# Patient Record
Sex: Female | Born: 1990 | Race: White | Hispanic: No | Marital: Married | Smoking: Never smoker
Health system: Southern US, Community
[De-identification: ages and names within clinical notes are randomized; demographics above are authoritative.]

## PROBLEM LIST (undated history)

## (undated) DIAGNOSIS — F419 Anxiety disorder, unspecified: Secondary | ICD-10-CM

## (undated) HISTORY — DX: Anxiety disorder, unspecified: F41.9

## (undated) HISTORY — PX: WISDOM TOOTH EXTRACTION: SHX21

---
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
## 2014-07-11 ENCOUNTER — Encounter: Payer: Self-pay | Admitting: Family

## 2014-07-11 ENCOUNTER — Ambulatory Visit (INDEPENDENT_AMBULATORY_CARE_PROVIDER_SITE_OTHER): Payer: 59 | Admitting: Family

## 2014-07-11 VITALS — BP 100/78 | HR 75 | Temp 97.7°F | Resp 14 | Ht 62.0 in | Wt 185.8 lb

## 2014-07-11 DIAGNOSIS — F411 Generalized anxiety disorder: Secondary | ICD-10-CM

## 2014-07-11 LAB — BASIC METABOLIC PANEL
BUN: 11 mg/dL (ref 6–23)
CALCIUM: 9.5 mg/dL (ref 8.4–10.5)
CO2: 26 meq/L (ref 19–32)
CREATININE: 0.73 mg/dL (ref 0.40–1.20)
Chloride: 107 mEq/L (ref 96–112)
GFR: 102.33 mL/min (ref >60.00)
GLUCOSE: 78 mg/dL (ref 70–99)
Potassium: 3.7 mEq/L (ref 3.5–5.1)
Sodium: 139 mEq/L (ref 135–145)

## 2014-07-11 LAB — TSH: TSH: 1.44 u[IU]/mL (ref 0.35–4.50)

## 2014-07-11 NOTE — Progress Notes (Signed)
Pre visit review using our clinic review tool, if applicable. No additional management support is needed unless otherwise documented below in the visit note. 

## 2014-07-11 NOTE — Patient Instructions (Signed)
Please complete lab work prior to leaving.   Schedule a physical at the front desk.  Welcome to [facility]!

## 2014-07-11 NOTE — Progress Notes (Signed)
Subjective:    Patient ID: Katherine Hardy, female    DOB: Oct 26, 1990, 24 y.o.   MRN: [id number]  HPI  [name] is a 24 yr old female who presents today to establish care.  Reports that she has suffered from anxiety since age 89.  Reports that recently when she lays down to sleep she has been having more anxiety.  Reports that she was on prozac in college- had SI with prozac.  She denies depression Sister and gm have anxiety.     Review of Systems  Constitutional: Negative for unexpected weight change.  HENT: Negative for hearing loss and rhinorrhea.   Eyes: Negative for visual disturbance.  Respiratory: Negative for cough.   Cardiovascular: Negative for leg swelling.       Occasional palpitations with anxiety  Gastrointestinal: Negative for nausea, diarrhea and constipation.  Genitourinary: Negative for dysuria and frequency.  Musculoskeletal: Negative for myalgias and arthralgias.  Skin: Negative for rash.  Neurological: Negative for headaches.  Hematological: Negative for adenopathy.  Psychiatric/Behavioral:       See HPI    Past Medical History  Diagnosis Date  . Anxiety     History   Social History  . Marital Status: Married    Spouse Name: N/A  . Number of Children: N/A  . Years of Education: N/A   Occupational History  . Not on file.   Social History Main Topics  . Smoking status: Never Smoker   . Smokeless tobacco: Never Used  . Alcohol Use: Yes     Comment: social  . Drug Use: No  . Sexual Activity: Not on file   Other Topics Concern  . 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 No pertinent past surgical history.  Family History  Problem Relation Age of Onset  . Hypertension Mother   . Hyperlipidemia Maternal Grandmother   . Macular degeneration Maternal Grandmother   . Hyperlipidemia Maternal Grandfather   . Heart disease Paternal Grandfather  60    CABG/stent  . Diabetes Paternal Grandfather   . Cancer Neg Hx     Allergies  Allergen Reactions  . Sulfa Antibiotics Other (See Comments)    Syncope, n/v     No current outpatient prescriptions on file prior to visit.   No current facility-administered medications on file prior to visit.    BP 100/78 mmHg  Pulse 75  Temp(Src) 97.7 F (36.5 C) (Oral)  Resp 14  Ht  (1.575 m)  Wt 185 lb 12.8 oz (84.278 kg)  BMI 33.97 kg/m2  SpO2 99%  LMP 07/10/2014   .    Objective:   Physical Exam  Constitutional: She is oriented to person, place, and time. She appears well-developed and well-nourished. No distress.  HENT:  Head: Normocephalic and atraumatic.  Cardiovascular: Normal rate and regular rhythm.   No murmur heard. Pulmonary/Chest: Effort normal and breath sounds normal. No respiratory distress. She has no wheezes. She has no rales. She exhibits no tenderness.  Lymphadenopathy:    She has no cervical adenopathy.  Neurological: She is alert and oriented to person, place, and time.  Skin: Skin is warm and dry.  Psychiatric: She has a normal mood and affect. Her behavior is normal. Judgment and thought content normal.          Assessment & Plan:  20 min spent with pt today.  >50% of this time was spent counseling pt on anxiety.

## 2014-07-11 NOTE — Assessment & Plan Note (Signed)
Reports that she and her husband are trying to conceive. We discussed referral to a therapist. Reports she has tried this in the past and found this unhelpful.  Advised against the use of prn benzos due to pregnancy category.  Monitor for now.  We did discuss a prenatal vitamin and she tells me she is taking one.

## 2014-07-14 ENCOUNTER — Ambulatory Visit (INDEPENDENT_AMBULATORY_CARE_PROVIDER_SITE_OTHER): Payer: 59 | Admitting: Adult Health

## 2014-07-14 ENCOUNTER — Encounter: Payer: Self-pay | Admitting: Adult Health

## 2014-07-14 VITALS — BP 110/80 | HR 76 | Temp 98.2°F | Resp 18 | Ht 62.0 in | Wt 181.0 lb

## 2014-07-14 DIAGNOSIS — B9789 Other viral agents as the cause of diseases classified elsewhere: Secondary | ICD-10-CM

## 2014-07-14 DIAGNOSIS — J038 Acute tonsillitis due to other specified organisms: Secondary | ICD-10-CM

## 2014-07-14 MED ORDER — LIDOCAINE VISCOUS 2 % MT SOLN: 15.0000 mL | OROMUCOSAL | Status: DC | PRN

## 2014-07-14 NOTE — Progress Notes (Signed)
   Subjective:    Patient ID: Katherine Hardy, female    DOB: 02/04/1991, 24 y.o.   MRN: [id number]  HPI  [name] presents to the office today for sore throat, fever, non productive cough, and bilateral ear pain for three days after taking care of someone in the ER with strep. She endorses fever up to 101 F at home and has been taking Ibuprofen for relief of fever and throat pain. Taking Delsym for dry hacking cough- which she endorses works well.     Review of Systems  Constitutional: Positive for fever and fatigue. Negative for activity change and appetite change.  HENT: Positive for ear pain, postnasal drip and sore throat.   Respiratory: Positive for cough. Negative for shortness of breath.        Non productive  Gastrointestinal: Negative for nausea, vomiting and diarrhea.  Musculoskeletal: Negative for myalgias, neck pain and neck stiffness.  Neurological: Negative for dizziness, light-headedness and headaches.   Past Medical History  Diagnosis Date  . Anxiety     History   Social History  . Marital Status: Married    Spouse Name: N/A  . Number of Children: N/A  . Years of Education: N/A   Occupational History  . Not on file.   Social History Main Topics  . Smoking status: Never Smoker   . Smokeless tobacco: Never Used  . Alcohol Use: Yes     Comment: social  . Drug Use: No  . Sexual Activity: Not on file   Other Topics Concern  . 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 Sulfa Antibiotics Other (See Comments)    Syncope, n/v    BP 110/80 mmHg  Pulse 76  Temp(Src) 98.2 F (36.8 C) (Oral)  Resp 18  Ht 5\' 2"  (1.575 m)  Wt 181 lb (82.101 kg)  BMI 33.10 kg/m2  SpO2 99%  LMP 07/10/2014      Objective:   Physical Exam  Constitutional: She is oriented to person, place, and time. She appears well-developed and well-nourished.  HENT:  Head:  Normocephalic.  Right Ear: External ear normal.  Left Ear: External ear normal.  Mouth/Throat: Oropharynx is clear and moist. No oropharyngeal exudate.  Back of throat with slight erythemia.    Eyes: Right eye exhibits no discharge. Left eye exhibits no discharge.  Cardiovascular: Normal rate and normal heart sounds.   Pulmonary/Chest: Effort normal and breath sounds normal. No respiratory distress. She has no wheezes. She has no rales.  Musculoskeletal: Normal range of motion.  Lymphadenopathy:    She has cervical adenopathy.  Neurological: She is alert and oriented to person, place, and time.  Skin: Skin is warm and dry.  Vitals reviewed.     Assessment & Plan:    Viral Tonsilitis - Rapid strep was negative - Centor criteria was 2 - Sent strep test to lab due to high suspicion of strep  - Sent prescription for lidocaine viscous to pharmacy to help with sore throat - OTC Delysm for cough - OTC ibuprofen for fever - Will follow up with patient once lab is back.  - Advised to call if not feeling better in three days.

## 2014-07-14 NOTE — Patient Instructions (Addendum)
Rapid strep came back negative, you likely have a viral infection. I will send it to the lab and should have the results by Monday. I will call you if it comes back positive for strep.  I have sent a prescription to your pharmacy for lidocaine viscous. Gargle and spit 15ml as needed for relief from your sore throat  Please let me know if you are not feeling any better in 3 days. Get plenty of rest and drink plenty of fluids. I hope you feel better!

## 2014-07-14 NOTE — Progress Notes (Signed)
Pre visit review using our clinic review tool, if applicable. No additional management support is needed unless otherwise documented below in the visit note. 

## 2014-07-15 LAB — STREP A DNA PROBE: GASP: NEGATIVE

## 2014-08-22 ENCOUNTER — Telehealth: Payer: Self-pay | Admitting: Family

## 2014-08-22 NOTE — Telephone Encounter (Signed)
previst letter for annual physical mailes °

## 2014-09-11 ENCOUNTER — Telehealth: Payer: Self-pay | Admitting: *Deleted

## 2014-09-11 NOTE — Telephone Encounter (Signed)
Patient cancelled appointment.

## 2014-09-12 ENCOUNTER — Encounter: Payer: 59 | Admitting: Family

## 2014-10-25 ENCOUNTER — Ambulatory Visit: Payer: 59 | Admitting: Cardiology

## 2014-11-29 ENCOUNTER — Ambulatory Visit (INDEPENDENT_AMBULATORY_CARE_PROVIDER_SITE_OTHER): Payer: 59 | Admitting: Family

## 2014-11-29 ENCOUNTER — Encounter: Payer: Self-pay | Admitting: Family

## 2014-11-29 ENCOUNTER — Telehealth: Payer: Self-pay | Admitting: Family

## 2014-11-29 VITALS — BP 116/78 | HR 88 | Temp 97.8°F | Resp 18 | Wt 190.4 lb

## 2014-11-29 DIAGNOSIS — R509 Fever, unspecified: Secondary | ICD-10-CM

## 2014-11-29 LAB — POCT URINALYSIS DIPSTICK
BILIRUBIN UA: NEGATIVE
Glucose, UA: NEGATIVE
KETONES UA: NEGATIVE
Leukocytes, UA: NEGATIVE
Nitrite, UA: NEGATIVE
RBC UA: NEGATIVE
Spec Grav, UA: 1.025
UROBILINOGEN UA: 1
pH, UA: 6

## 2014-11-29 NOTE — Telephone Encounter (Signed)
Caller name:Paullin, Adanely Relation to ZO:XWRU Call back number:[phone] Pharmacy:  Reason for call: pt states that she injuried her back and the ER gave her a rx robaxin, pt states she has been running a fever since she has been on the meds, pt would like to know if she stop taking the meds will she be ok, states she had researched it and it stated to contact her doctor while taking the meds if any concerns.

## 2014-11-29 NOTE — Telephone Encounter (Signed)
Spoke w pt and pt states fever has been running between 101 and 102. Scheduled pt for 215 appt today.

## 2014-11-29 NOTE — Addendum Note (Signed)
Addended by: Neldon Labella on: 11/29/2014 02:51 PM   Modules accepted: Orders

## 2014-11-29 NOTE — Progress Notes (Addendum)
   Subjective:    Patient ID: Katherine Hardy, female    DOB: 1991/01/03, 24 y.o.   MRN: [id number]  HPI  Katherine Hardy is a 24 yr old female who presents today with chief complaint of fever.  She works in the ED as a Engineer, civil (consulting) and reports that she developed left sided neck pain which radiated up the back of her neck. Was given rx for robaxin by one of the ER physicians.  Reports that her neck pain is feeling better.    Today she develop fever, myalgia, fatigue with a Tmax of 102.  Reports that she took motrin  and her fever has come down.      Review of Systems  Constitutional: Positive for fever, chills and fatigue.  HENT: Negative for ear pain and rhinorrhea.        Denies throat pain.   Respiratory: Negative for cough.   Gastrointestinal: Negative for nausea, vomiting and diarrhea.  Genitourinary: Negative for dysuria, frequency and hematuria.  Musculoskeletal: Positive for myalgias.      see HPI  Past Medical History  Diagnosis Date  . Anxiety     History   Social History  . Marital Status: Married    Spouse Name: N/A  . Number of Children: N/A  . Years of Education: N/A   Occupational History  . Not on file.   Social History Main Topics  . Smoking status: Never Smoker   . Smokeless tobacco: Never Used  . Alcohol Use: Yes     Comment: social  . Drug Use: No  . Sexual Activity: Not on file   Other Topics Concern  . 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  Family History  Problem Relation Age of Onset  . Hypertension Mother   . Hyperlipidemia Maternal Grandmother   . Macular degeneration Maternal Grandmother   . Hyperlipidemia Maternal Grandfather   . Heart disease Paternal Grandfather 60    CABG/stent  . Diabetes Paternal Grandfather   . Cancer Neg Hx     Allergies  Allergen Reactions  . Sulfa Antibiotics Other (See Comments)   Syncope, n/v     No current outpatient prescriptions on file prior to visit.   No current facility-administered medications on file prior to visit.    BP 116/78 mmHg  Pulse 88  Temp(Src) 97.8 F (36.6 C) (Oral)  Resp 18  Wt 190 lb 6.4 oz (86.365 kg)  SpO2 98%  LMP 11/27/2014    Objective:   Physical Exam  Constitutional: She is oriented to person, place, and time. She appears well-developed and well-nourished.  HENT:  Head: Normocephalic and atraumatic.  Right Ear: Tympanic membrane and ear canal normal.  Left Ear: Tympanic membrane and ear canal normal.  Mouth/Throat: No oropharyngeal exudate, posterior oropharyngeal edema or posterior oropharyngeal erythema.  Neck:  No nuchal rigidity  Cardiovascular: Normal rate, regular rhythm and normal heart sounds.   No murmur heard. Pulmonary/Chest: Effort normal and breath sounds normal. No respiratory distress. She has no wheezes.  Neurological: She is alert and oriented to person, place, and time.  Psychiatric: She has a normal mood and affect. Her behavior is normal. Judgment and thought content normal.          Assessment & Plan:

## 2014-11-29 NOTE — Patient Instructions (Signed)
Continue tylenol  or motrin  every 6 hours as needed for pain/fever. Drink 6-8 glasses of fluid a day. Get plenty of rest. Call if symptoms worsen or if you are not feeling better in 3 days.

## 2014-11-29 NOTE — Progress Notes (Signed)
Pre visit review using our clinic review tool, if applicable. No additional management support is needed unless otherwise documented below in the visit note. 

## 2014-11-29 NOTE — Assessment & Plan Note (Signed)
Suspect that this is due to underlying viral illness.  Advise alternate tylenol/motrin as needed for pain fever. Call if symptoms worsen or if symptoms do not improve.

## 2014-11-29 NOTE — Telephone Encounter (Signed)
Fever is listed as potential side effect.  Please advise.

## 2014-11-29 NOTE — Telephone Encounter (Signed)
Doubt robaxin is cause. I think pt should be seen in the office for further evaluation.

## 2014-12-01 ENCOUNTER — Encounter: Payer: Self-pay | Admitting: Emergency Medicine

## 2014-12-01 ENCOUNTER — Emergency Department
Admission: EM | Admit: 2014-12-01 | Discharge: 2014-12-01 | Disposition: A | Discharge status: Home / Self Care | Payer: 59 | Source: Home / Self Care | Attending: Family Medicine | Admitting: Family Medicine

## 2014-12-01 ENCOUNTER — Emergency Department (INDEPENDENT_AMBULATORY_CARE_PROVIDER_SITE_OTHER): Payer: 59

## 2014-12-01 ENCOUNTER — Telehealth: Payer: Self-pay | Admitting: Family

## 2014-12-01 DIAGNOSIS — J181 Lobar pneumonia, unspecified organism: Principal | ICD-10-CM

## 2014-12-01 DIAGNOSIS — J189 Pneumonia, unspecified organism: Secondary | ICD-10-CM | POA: Diagnosis not present

## 2014-12-01 DIAGNOSIS — R509 Fever, unspecified: Secondary | ICD-10-CM

## 2014-12-01 MED ORDER — AZITHROMYCIN 250 MG PO TABS: ORAL_TABLET | ORAL | Status: DC

## 2014-12-01 MED ORDER — BENZONATATE 200 MG PO CAPS: 200.0000 mg | ORAL_CAPSULE | Freq: Every day | ORAL | Status: DC

## 2014-12-01 NOTE — Telephone Encounter (Signed)
Left detailed message that order has been placed for CXR and she can come to [facility] to complete cxr this afternoon.

## 2014-12-01 NOTE — ED Notes (Signed)
Fever x 4 days, cough, chest discomfort x 2 days

## 2014-12-01 NOTE — ED Provider Notes (Signed)
CSN: [id number]     Arrival date & time 12/01/14  1111 History   First MD Initiated Contact with Patient 12/01/14 1144     Chief Complaint  Patient presents with  . Fever      HPI Comments: Patient developed a fever with chills four days ago.  Her fever has been persistent and varies between 101.5 and 103.  This morning her fever was 101.5.  Yesterday she developed a non-productive cough.  No pleuritic pain but she has mild shortness of breath when supine.  No nasal congestion or sore throat.   Her last Tdap was in 2015.  The history is provided by the patient.    Past Medical History  Diagnosis Date  . Anxiety    History reviewed. No pertinent past surgical history. Family History  Problem Relation Age of Onset  . Hypertension Mother   . Hyperlipidemia Maternal Grandmother   . Macular degeneration Maternal Grandmother   . Hyperlipidemia Maternal Grandfather   . Heart disease Paternal Grandfather 60    CABG/stent  . Diabetes Paternal Grandfather   . Cancer Neg Hx    History  Substance Use Topics  . Smoking status: Never Smoker   . Smokeless tobacco: Never Used  . Alcohol Use: Yes     Comment: social   OB History    No data available     Review of Systems No sore throat + cough No pleuritic pain No wheezing No nasal congestion ? post-nasal drainage No sinus pain/pressure No itchy/red eyes No earache No hemoptysis + mild SOB when supine + fever, + chills + nausea, resolved No vomiting No abdominal pain No diarrhea No urinary symptoms No skin rash + fatigue No myalgias No  headache    Allergies  Ppd and Sulfa antibiotics  Home Medications   Prior to Admission medications   Medication Sig Start Date End Date Taking? Authorizing Provider  acetaminophen (TYLENOL) 325 MG tablet Take 650 mg by mouth every 6 (six) hours as needed.   Yes Historical Provider, MD  ibuprofen (ADVIL,MOTRIN) 400 MG tablet Take 400 mg by mouth every 6 (six) hours as needed.   Yes  Historical Provider, MD  azithromycin (ZITHROMAX Z-PAK) 250 MG tablet Take 2 tabs today; then begin one tab once daily for 4 more days. 12/01/14   Lattie Haw, MD  benzonatate (TESSALON) 200 MG capsule Take 1 capsule (200 mg total) by mouth at bedtime. Take as needed for cough 12/01/14   Lattie Haw, MD   BP 116/85 mmHg  Pulse 90  Temp(Src) 98.5 F (36.9 C) (Oral)  Ht 5\' 2"  (1.575 m)  Wt 191 lb (86.637 kg)  BMI 34.93 kg/m2  SpO2 96%  LMP 11/27/2014 Physical Exam Nursing notes and Vital Signs reviewed. Appearance:  Patient appears stated age, and in no acute distress Eyes:  Pupils are equal, round, and reactive to light and accomodation.  Extraocular movement is intact.  Conjunctivae are not inflamed  Ears:  Canals normal.  Tympanic membranes normal.  Nose:  Mildly congested turbinates.  No sinus tenderness.   Pharynx:  Normal Neck:  Supple.   Slightly enlarged tender left posterior nodes are palpated   Lungs:  Clear to auscultation.  Breath sounds are equal.  Moving air well. Heart:  Regular rate and rhythm without murmurs, rubs, or gallops.  Rate 104 Abdomen:  Nontender without masses or hepatosplenomegaly.  Bowel sounds are present.  No CVA or flank tenderness.  Extremities:  No edema.  No  calf tenderness Skin:  No rash present.   ED Course  Procedures  None    Labs Reviewed  POCT CBC W AUTO DIFF ([facility]):  WBC 4.8; LY 18.9; MO 5.4; GR 75.7; Hgb 14.1; Platelets 228     Imaging Review Dg Chest 2 View  12/01/2014   CLINICAL DATA:  Fever for 4 days, nonproductive cough  EXAM: CHEST  2 VIEW  COMPARISON:  None.  FINDINGS: Cardiomediastinal silhouette is unremarkable. Mild mid thoracic dextroscoliosis. There is infiltrate/pneumonia in left lower lobe posteriorly best seen on lateral view. No pulmonary edema. Right lung is clear.  IMPRESSION: Infiltrate/pneumonia in left lower lobe posteriorly best seen on lateral view.   Electronically Signed   By: Natasha Mead M.D.    On: 12/01/2014 12:56     MDM   1. Left lower lobe pneumonia; suspect atypical agent such as Mycoplasma    Begin Z-pack.  Prescription written for Benzonatate [facility]) to take at bedtime for night-time cough.  Take plain guaifenesin (  extended release tabs such as Mucinex) twice daily, with plenty of water, for cough and congestion.  Get adequate rest.   Stop all antihistamines for now, and other non-prescription cough/cold preparations. Followup with Family Doctor if not improved in one week.        Lattie Haw, MD 12/01/14 [phone]

## 2014-12-01 NOTE — Telephone Encounter (Signed)
Caller name: Katherine Hardy Relationship to patient: Self  Can be reached: [phone] Pharmacy:  Reason for call: pt still have low fever and would like to know if you would like to take a chest x-ray to r/o bronchitis? She says last night when she laid down she was having some shortness of breath.

## 2014-12-01 NOTE — Discharge Instructions (Signed)
Take plain guaifenesin (1200mg  extended release tabs such as Mucinex) twice daily, with plenty of water, for cough and congestion.  Get adequate rest.   Stop all antihistamines for now, and other non-prescription cough/cold preparations.   Pneumonia Pneumonia is an infection of the lungs.  CAUSES Pneumonia may be caused by bacteria or a virus. Usually, these infections are caused by breathing infectious particles into the lungs (respiratory tract). SIGNS AND SYMPTOMS   Cough.  Fever.  Chest pain.  Increased rate of breathing.  Wheezing.  Mucus production. DIAGNOSIS  If you have the common symptoms of pneumonia, your health care provider will typically confirm the diagnosis with a chest X-ray. The X-ray will show an abnormality in the lung (pulmonary infiltrate) if you have pneumonia. Other tests of your blood, urine, or sputum may be done to find the specific cause of your pneumonia. Your health care provider may also do tests (blood gases or pulse oximetry) to see how well your lungs are working. TREATMENT  Some forms of pneumonia may be spread to other people when you cough or sneeze. You may be asked to wear a mask before and during your exam. Pneumonia that is caused by bacteria is treated with antibiotic medicine. Pneumonia that is caused by the influenza virus may be treated with an antiviral medicine. Most other viral infections must run their course. These infections will not respond to antibiotics.  HOME CARE INSTRUCTIONS   Cough suppressants may be used if you are losing too much rest. However, coughing protects you by clearing your lungs. You should avoid using cough suppressants if you can.  Your health care provider may have prescribed medicine if he or she thinks your pneumonia is caused by bacteria or influenza. Finish your medicine even if you start to feel better.  Your health care provider may also prescribe an expectorant. This loosens the mucus to be coughed  up.  Take medicines only as directed by your health care provider.  Do not smoke. Smoking is a common cause of bronchitis and can contribute to pneumonia. If you are a smoker and continue to smoke, your cough may last several weeks after your pneumonia has cleared.  A cold steam vaporizer or humidifier in your room or home may help loosen mucus.  Coughing is often worse at night. Sleeping in a semi-upright position in a recliner or using a couple pillows under your head will help with this.  Get rest as you feel it is needed. Your body will usually let you know when you need to rest. PREVENTION A pneumococcal shot (vaccine) is available to prevent a common bacterial cause of pneumonia. This is usually suggested for:  People over 57 years old.  Patients on chemotherapy.  People with chronic lung problems, such as bronchitis or emphysema.  People with immune system problems. If you are over 65 or have a high risk condition, you may receive the pneumococcal vaccine if you have not received it before. In some countries, a routine influenza vaccine is also recommended. This vaccine can help prevent some cases of pneumonia.You may be offered the influenza vaccine as part of your care. If you smoke, it is time to quit. You may receive instructions on how to stop smoking. Your health care provider can provide medicines and counseling to help you quit. SEEK MEDICAL CARE IF: You have a fever. SEEK IMMEDIATE MEDICAL CARE IF:   Your illness becomes worse. This is especially true if you are elderly or weakened from any  other disease.  You cannot control your cough with suppressants and are losing sleep.  You begin coughing up blood.  You develop pain which is getting worse or is uncontrolled with medicines.  Any of the symptoms which initially brought you in for treatment are getting worse rather than better.  You develop shortness of breath or chest pain. MAKE SURE YOU:   Understand  these instructions.  Will watch your condition.  Will get help right away if you are not doing well or get worse. Document Released: 04/14/2005 Document Revised: 08/29/2013 Document Reviewed: 07/04/2010 [facility] Patient Information 2015 [place], [place]. This information is not intended to replace advice given to you by your health care provider. Make sure you discuss any questions you have with your health care provider.

## 2014-12-04 ENCOUNTER — Telehealth: Payer: Self-pay | Admitting: *Deleted

## 2014-12-14 ENCOUNTER — Telehealth: Payer: Self-pay | Admitting: Behavioral Health

## 2014-12-14 NOTE — Telephone Encounter (Signed)
Unable to reach patient at time of Pre-Visit Call.  Left message for patient to return call when available.    

## 2014-12-15 ENCOUNTER — Telehealth: Payer: Self-pay | Admitting: Family

## 2014-12-15 ENCOUNTER — Encounter: Payer: 59 | Admitting: Family

## 2014-12-18 NOTE — Telephone Encounter (Signed)
Pt was no show 12/15/14 9:00am, 8/19 pt called at 6:09am today states she had to cancel her appt because she was called in to work, left msg for pt to call and reschedule, charge for no show?

## 2014-12-21 NOTE — Telephone Encounter (Signed)
No charge. 

## 2015-03-26 LAB — OB RESULTS CONSOLE RUBELLA ANTIBODY, IGM: Rubella: IMMUNE

## 2015-03-26 LAB — OB RESULTS CONSOLE GC/CHLAMYDIA
CHLAMYDIA, DNA PROBE: NEGATIVE
GC PROBE AMP, GENITAL: NEGATIVE

## 2015-03-26 LAB — OB RESULTS CONSOLE RPR: RPR: NONREACTIVE

## 2015-03-26 LAB — OB RESULTS CONSOLE ABO/RH: RH Type: POSITIVE

## 2015-03-26 LAB — OB RESULTS CONSOLE HEPATITIS B SURFACE ANTIGEN: Hepatitis B Surface Ag: NEGATIVE

## 2015-03-26 LAB — OB RESULTS CONSOLE HIV ANTIBODY (ROUTINE TESTING): HIV: NONREACTIVE

## 2015-03-26 LAB — OB RESULTS CONSOLE ANTIBODY SCREEN: Antibody Screen: NEGATIVE

## 2015-04-29 NOTE — L&D Delivery Note (Signed)
Delivery Note At 9:51 AM a viable and healthy female was delivered via Vaginal, Spontaneous Delivery (Presentation: ; Occiput Anterior).  APGAR: 9, 9; weight  pending .   Placenta status: Intact, Spontaneous.  Cord: 3 vessels with the following complications: None.  Cord pH: na  Anesthesia: Epidural  Episiotomy: None Lacerations:  second Suture Repair: 2.0 3.0 vicryl rapide Est. Blood Loss (mL):  100  Mom to postpartum.  Baby to Couplet care / Skin to Skin.  Jeroline Wolbert J 10/26/2015, 10:26 AM

## 2015-05-24 DIAGNOSIS — Z36 Encounter for antenatal screening of mother: Secondary | ICD-10-CM | POA: Diagnosis not present

## 2015-06-12 DIAGNOSIS — O365910 Maternal care for other known or suspected poor fetal growth, first trimester, not applicable or unspecified: Secondary | ICD-10-CM | POA: Diagnosis not present

## 2015-06-12 DIAGNOSIS — Z3A19 19 weeks gestation of pregnancy: Secondary | ICD-10-CM | POA: Diagnosis not present

## 2015-08-07 DIAGNOSIS — Z36 Encounter for antenatal screening of mother: Secondary | ICD-10-CM | POA: Diagnosis not present

## 2015-08-07 DIAGNOSIS — Z23 Encounter for immunization: Secondary | ICD-10-CM | POA: Diagnosis not present

## 2015-09-10 DIAGNOSIS — O2613 Low weight gain in pregnancy, third trimester: Secondary | ICD-10-CM | POA: Diagnosis not present

## 2015-09-10 DIAGNOSIS — Z3A32 32 weeks gestation of pregnancy: Secondary | ICD-10-CM | POA: Diagnosis not present

## 2015-09-10 DIAGNOSIS — N898 Other specified noninflammatory disorders of vagina: Secondary | ICD-10-CM | POA: Diagnosis not present

## 2015-10-15 DIAGNOSIS — Z3A37 37 weeks gestation of pregnancy: Secondary | ICD-10-CM | POA: Diagnosis not present

## 2015-10-15 DIAGNOSIS — O2613 Low weight gain in pregnancy, third trimester: Secondary | ICD-10-CM | POA: Diagnosis not present

## 2015-10-15 DIAGNOSIS — Z3403 Encounter for supervision of normal first pregnancy, third trimester: Secondary | ICD-10-CM | POA: Diagnosis not present

## 2015-10-15 DIAGNOSIS — Z36 Encounter for antenatal screening of mother: Secondary | ICD-10-CM | POA: Diagnosis not present

## 2015-10-15 LAB — OB RESULTS CONSOLE GBS: STREP GROUP B AG: NEGATIVE

## 2015-10-25 ENCOUNTER — Inpatient Hospital Stay (HOSPITAL_COMMUNITY)
Admission: AD | Admit: 2015-10-25 | Discharge: 2015-10-27 | DRG: 775 | Disposition: A | Discharge status: Home / Self Care | Payer: 59 | Source: Ambulatory Visit | Attending: Obstetrics and Gynecology | Admitting: Obstetrics and Gynecology

## 2015-10-25 ENCOUNTER — Inpatient Hospital Stay (HOSPITAL_COMMUNITY)
Admission: AD | Admit: 2015-10-25 | Discharge: 2015-10-25 | Disposition: A | Discharge status: Home / Self Care | Payer: 59 | Source: Ambulatory Visit | Attending: Obstetrics and Gynecology | Admitting: Obstetrics and Gynecology

## 2015-10-25 ENCOUNTER — Encounter (HOSPITAL_COMMUNITY): Payer: Self-pay | Admitting: *Deleted

## 2015-10-25 DIAGNOSIS — O4202 Full-term premature rupture of membranes, onset of labor within 24 hours of rupture: Principal | ICD-10-CM | POA: Diagnosis present

## 2015-10-25 DIAGNOSIS — Z8249 Family history of ischemic heart disease and other diseases of the circulatory system: Secondary | ICD-10-CM

## 2015-10-25 DIAGNOSIS — Z3A38 38 weeks gestation of pregnancy: Secondary | ICD-10-CM

## 2015-10-25 DIAGNOSIS — Z833 Family history of diabetes mellitus: Secondary | ICD-10-CM | POA: Diagnosis not present

## 2015-10-25 DIAGNOSIS — K219 Gastro-esophageal reflux disease without esophagitis: Secondary | ICD-10-CM | POA: Diagnosis present

## 2015-10-25 DIAGNOSIS — O3663X0 Maternal care for excessive fetal growth, third trimester, not applicable or unspecified: Secondary | ICD-10-CM | POA: Diagnosis present

## 2015-10-25 DIAGNOSIS — IMO0001 Reserved for inherently not codable concepts without codable children: Secondary | ICD-10-CM

## 2015-10-25 DIAGNOSIS — O9962 Diseases of the digestive system complicating childbirth: Secondary | ICD-10-CM | POA: Diagnosis present

## 2015-10-25 LAB — TYPE AND SCREEN
ABO/RH(D): O POS
Antibody Screen: NEGATIVE

## 2015-10-25 LAB — CBC
HEMATOCRIT: 37.1 % (ref 36.0–46.0)
HEMOGLOBIN: 12.9 g/dL (ref 12.0–15.0)
MCH: 29.3 pg (ref 26.0–34.0)
MCHC: 34.8 g/dL (ref 30.0–36.0)
MCV: 84.3 fL (ref 78.0–100.0)
PLATELETS: 211 10*3/uL (ref 150–400)
RBC: 4.4 MIL/uL (ref 3.87–5.11)
RDW: 13.2 % (ref 11.5–15.5)
WBC: 9.6 10*3/uL (ref 4.0–10.5)

## 2015-10-25 LAB — POCT FERN TEST: POCT FERN TEST: POSITIVE

## 2015-10-25 MED ORDER — OXYCODONE-ACETAMINOPHEN 5-325 MG PO TABS: 1.0000 | ORAL_TABLET | ORAL | Status: DC | PRN

## 2015-10-25 MED ORDER — OXYTOCIN 40 UNITS IN LACTATED RINGERS INFUSION - SIMPLE MED: 2.5000 [IU]/h | INTRAVENOUS | Status: DC

## 2015-10-25 MED ORDER — OXYTOCIN BOLUS FROM INFUSION: 500.0000 mL | INTRAVENOUS | Status: DC

## 2015-10-25 MED ORDER — LACTATED RINGERS IV SOLN
INTRAVENOUS | Status: DC
  Administered 2015-10-25: 22:00:00 via INTRAVENOUS

## 2015-10-25 MED ORDER — FLEET ENEMA 7-19 GM/118ML RE ENEM: 1.0000 | ENEMA | RECTAL | Status: DC | PRN

## 2015-10-25 MED ORDER — OXYCODONE-ACETAMINOPHEN 5-325 MG PO TABS: 2.0000 | ORAL_TABLET | ORAL | Status: DC | PRN

## 2015-10-25 MED ORDER — ONDANSETRON HCL 4 MG/2ML IJ SOLN
4.0000 mg | Freq: Four times a day (QID) | INTRAMUSCULAR | Status: DC | PRN
Start: 2015-10-25 — End: 2015-10-26

## 2015-10-25 MED ORDER — LACTATED RINGERS IV SOLN
500.0000 mL | INTRAVENOUS | Status: DC | PRN
  Administered 2015-10-25: 500 mL via INTRAVENOUS
  Administered 2015-10-26: 250 mL via INTRAVENOUS
  Administered 2015-10-26: 500 mL via INTRAVENOUS

## 2015-10-25 MED ORDER — ACETAMINOPHEN 325 MG PO TABS: 650.0000 mg | ORAL_TABLET | ORAL | Status: DC | PRN

## 2015-10-25 MED ORDER — LIDOCAINE HCL (PF) 1 % IJ SOLN
30.0000 mL | INTRAMUSCULAR | Status: DC | PRN
  Administered 2015-10-26: 30 mL via SUBCUTANEOUS
  Filled 2015-10-25: qty 30

## 2015-10-25 MED ORDER — SOD CITRATE-CITRIC ACID 500-334 MG/5ML PO SOLN
30.0000 mL | ORAL | Status: DC | PRN
  Administered 2015-10-26: 30 mL via ORAL
  Filled 2015-10-25: qty 15

## 2015-10-25 NOTE — MAU Note (Signed)
Notified provider that patient is 3/80/-2 ctx every 5-7 min. Fetus reactive. Provider said patient could ambulate for an hour then be rechecked.

## 2015-10-25 NOTE — MAU Note (Signed)
Patient presents at 5438 weeks gestation with c/o ruptured membranes at 1830 today. Fetus active. Denies pain, bleeding or discharge.

## 2015-10-25 NOTE — MAU Note (Signed)
Pt reports regular contractions, ?leaking fluid since 0800

## 2015-10-26 ENCOUNTER — Encounter (HOSPITAL_COMMUNITY): Payer: Self-pay | Admitting: Obstetrics and Gynecology

## 2015-10-26 ENCOUNTER — Inpatient Hospital Stay (HOSPITAL_COMMUNITY): Payer: 59 | Admitting: Anesthesiology

## 2015-10-26 LAB — ABO/RH: ABO/RH(D): O POS

## 2015-10-26 MED ORDER — DIPHENHYDRAMINE HCL 25 MG PO CAPS: 25.0000 mg | ORAL_CAPSULE | Freq: Four times a day (QID) | ORAL | Status: DC | PRN

## 2015-10-26 MED ORDER — EPHEDRINE 5 MG/ML INJ
10.0000 mg | INTRAVENOUS | Status: DC | PRN
  Filled 2015-10-26: qty 2

## 2015-10-26 MED ORDER — TERBUTALINE SULFATE 1 MG/ML IJ SOLN
0.2500 mg | Freq: Once | INTRAMUSCULAR | Status: DC | PRN
  Filled 2015-10-26: qty 1

## 2015-10-26 MED ORDER — PHENYLEPHRINE 40 MCG/ML (10ML) SYRINGE FOR IV PUSH (FOR BLOOD PRESSURE SUPPORT)
80.0000 ug | PREFILLED_SYRINGE | INTRAVENOUS | Status: DC | PRN
  Filled 2015-10-26: qty 5

## 2015-10-26 MED ORDER — ONDANSETRON HCL 4 MG/2ML IJ SOLN: 4.0000 mg | INTRAMUSCULAR | Status: DC | PRN

## 2015-10-26 MED ORDER — IBUPROFEN 600 MG PO TABS
600.0000 mg | ORAL_TABLET | Freq: Four times a day (QID) | ORAL | Status: DC
  Administered 2015-10-26 – 2015-10-27 (×5): 600 mg via ORAL
  Filled 2015-10-26 (×5): qty 1

## 2015-10-26 MED ORDER — PRENATAL MULTIVITAMIN CH: 1.0000 | ORAL_TABLET | Freq: Every day | ORAL | Status: DC

## 2015-10-26 MED ORDER — SIMETHICONE 80 MG PO CHEW: 80.0000 mg | CHEWABLE_TABLET | ORAL | Status: DC | PRN

## 2015-10-26 MED ORDER — WITCH HAZEL-GLYCERIN EX PADS: 1.0000 "application " | MEDICATED_PAD | CUTANEOUS | Status: DC | PRN

## 2015-10-26 MED ORDER — DIBUCAINE 1 % RE OINT
1.0000 | TOPICAL_OINTMENT | RECTAL | Status: DC | PRN
Start: 2015-10-26 — End: 2015-10-27

## 2015-10-26 MED ORDER — LIDOCAINE HCL (PF) 1 % IJ SOLN
INTRAMUSCULAR | Status: DC | PRN
  Administered 2015-10-26 (×2): 6 mL via EPIDURAL

## 2015-10-26 MED ORDER — BENZOCAINE-MENTHOL 20-0.5 % EX AERO
1.0000 "application " | INHALATION_SPRAY | CUTANEOUS | Status: DC | PRN
  Administered 2015-10-26: 1 via TOPICAL
  Filled 2015-10-26: qty 56

## 2015-10-26 MED ORDER — ACETAMINOPHEN 325 MG PO TABS
650.0000 mg | ORAL_TABLET | ORAL | Status: DC | PRN
  Administered 2015-10-26 – 2015-10-27 (×2): 650 mg via ORAL
  Filled 2015-10-26 (×2): qty 2

## 2015-10-26 MED ORDER — DIPHENHYDRAMINE HCL 50 MG/ML IJ SOLN: 12.5000 mg | INTRAMUSCULAR | Status: DC | PRN

## 2015-10-26 MED ORDER — OXYTOCIN 40 UNITS IN LACTATED RINGERS INFUSION - SIMPLE MED
1.0000 m[IU]/min | INTRAVENOUS | Status: DC
  Administered 2015-10-26: 666 m[IU]/min via INTRAVENOUS
  Administered 2015-10-26: 1 m[IU]/min via INTRAVENOUS
  Filled 2015-10-26: qty 1000

## 2015-10-26 MED ORDER — LACTATED RINGERS IV SOLN: 500.0000 mL | Freq: Once | INTRAVENOUS | Status: DC

## 2015-10-26 MED ORDER — ONDANSETRON HCL 4 MG PO TABS: 4.0000 mg | ORAL_TABLET | ORAL | Status: DC | PRN

## 2015-10-26 MED ORDER — METHYLERGONOVINE MALEATE 0.2 MG PO TABS: 0.2000 mg | ORAL_TABLET | ORAL | Status: DC | PRN

## 2015-10-26 MED ORDER — TETANUS-DIPHTH-ACELL PERTUSSIS 5-2.5-18.5 LF-MCG/0.5 IM SUSP: 0.5000 mL | Freq: Once | INTRAMUSCULAR | Status: DC

## 2015-10-26 MED ORDER — FAMOTIDINE IN NACL 20-0.9 MG/50ML-% IV SOLN
20.0000 mg | Freq: Once | INTRAVENOUS | Status: AC
  Administered 2015-10-26: 20 mg via INTRAVENOUS
  Filled 2015-10-26: qty 50

## 2015-10-26 MED ORDER — PHENYLEPHRINE 40 MCG/ML (10ML) SYRINGE FOR IV PUSH (FOR BLOOD PRESSURE SUPPORT)
80.0000 ug | PREFILLED_SYRINGE | INTRAVENOUS | Status: DC | PRN
  Filled 2015-10-26: qty 10
  Filled 2015-10-26: qty 5

## 2015-10-26 MED ORDER — FENTANYL 2.5 MCG/ML BUPIVACAINE 1/10 % EPIDURAL INFUSION (WH - ANES)
14.0000 mL/h | INTRAMUSCULAR | Status: DC | PRN
  Administered 2015-10-26: 14 mL/h via EPIDURAL
  Filled 2015-10-26: qty 125

## 2015-10-26 MED ORDER — ZOLPIDEM TARTRATE 5 MG PO TABS: 5.0000 mg | ORAL_TABLET | Freq: Every evening | ORAL | Status: DC | PRN

## 2015-10-26 MED ORDER — METHYLERGONOVINE MALEATE 0.2 MG/ML IJ SOLN: 0.2000 mg | INTRAMUSCULAR | Status: DC | PRN

## 2015-10-26 MED ORDER — COCONUT OIL OIL: 1.0000 "application " | TOPICAL_OIL | Status: DC | PRN

## 2015-10-26 MED ORDER — SENNOSIDES-DOCUSATE SODIUM 8.6-50 MG PO TABS
2.0000 | ORAL_TABLET | ORAL | Status: DC
  Administered 2015-10-27: 2 via ORAL
  Filled 2015-10-26: qty 2

## 2015-10-26 MED ORDER — PHENYLEPHRINE 40 MCG/ML (10ML) SYRINGE FOR IV PUSH (FOR BLOOD PRESSURE SUPPORT)
80.0000 ug | PREFILLED_SYRINGE | INTRAVENOUS | Status: DC | PRN
  Filled 2015-10-26: qty 5
  Filled 2015-10-26: qty 10

## 2015-10-26 NOTE — Anesthesia Preprocedure Evaluation (Signed)
Anesthesia Evaluation  Patient identified by MRN, date of birth, ID band Patient awake    Reviewed: Allergy & Precautions, NPO status , Patient's Chart, lab work & pertinent test results  History of Anesthesia Complications Negative for: history of anesthetic complications  Airway Mallampati: IV  TM Distance: >3 FB Neck ROM: Full    Dental  (+) Dental Advisory Given   Pulmonary neg pulmonary ROS,    breath sounds clear to auscultation       Cardiovascular negative cardio ROS   Rhythm:Regular Rate:Normal     Neuro/Psych negative neurological ROS     GI/Hepatic Neg liver ROS, GERD  ,  Endo/Other  negative endocrine ROSMorbid obesity  Renal/GU negative Renal ROS     Musculoskeletal   Abdominal (+) + obese,   Peds  Hematology plt 211k   Anesthesia Other Findings   Reproductive/Obstetrics (+) Pregnancy                             Anesthesia Physical Anesthesia Plan  ASA: II  Anesthesia Plan: Epidural   Post-op Pain Management:    Induction:   Airway Management Planned: Natural Airway  Additional Equipment:   Intra-op Plan:   Post-operative Plan:   Informed Consent:   Plan Discussed with:   Anesthesia Plan Comments: (Patient identified. Risks/Benefits/Options discussed with patient including but not limited to bleeding, infection, nerve damage, paralysis, failed block, incomplete pain control, headache, blood pressure changes, nausea, vomiting, reactions to medication both or allergic, itching and postpartum back pain. Confirmed with bedside nurse the patient's most recent platelet count. Confirmed with patient that they are not currently taking any anticoagulation, have any bleeding history or any family history of bleeding disorders. Patient expressed understanding and wished to proceed. All questions were answered.  )        Anesthesia Quick Evaluation

## 2015-10-26 NOTE — Lactation Note (Signed)
This note was copied from a baby's chart. Lactation Consultation Note Initial visit at 12 hours of age.  Mom reports a few good feedings  And denies pain.  Baby is asleep in FOB's arms.  Mom is [employer] employee and attended classes.  [facility] resources given and discussed.  Encouraged to feed with early cues on demand.  Early newborn behavior discussed.  Hand expression reported by mom with colostrum visible.  Mom to call for assist as needed.    Patient Name: Katherine Hardy RUEAV'WToday's Date: 10/26/2015 Reason for consult: Initial assessment   Maternal Data Has patient been taught Hand Expression?: Yes Does the patient have breastfeeding experience prior to this delivery?: No  Feeding Feeding Type: Breast Fed Length of feed: 15 min  LATCH Score/Interventions Latch: Repeated attempts needed to sustain latch, nipple held in mouth throughout feeding, stimulation needed to elicit sucking reflex.  Audible Swallowing: A few with stimulation Intervention(s): Skin to skin  Type of Nipple: Everted at rest and after stimulation  Comfort (Breast/Nipple): Soft / non-tender     Hold (Positioning): No assistance needed to correctly position infant at breast. Intervention(s): Breastfeeding basics reviewed  LATCH Score: 8  Lactation Tools Discussed/Used WIC Program: No   Consult Status Consult Status: Follow-up Date: 10/27/15 Follow-up type: In-patient    Katherine Hardy, Arvella MerlesJana Hardy 10/26/2015, 10:26 PM

## 2015-10-26 NOTE — Anesthesia Procedure Notes (Signed)
Epidural Patient location during procedure: OB Start time: 10/26/2015 1:03 AM End time: 10/26/2015 1:24 AM  Staffing Anesthesiologist: Jairo BenJACKSON, Rayvon Brandvold Performed by: anesthesiologist   Preanesthetic Checklist Completed: patient identified, surgical consent, pre-op evaluation, timeout performed, IV checked, risks and benefits discussed and monitors and equipment checked  Epidural Patient position: sitting Prep: site prepped and draped and DuraPrep Patient monitoring: continuous pulse ox, blood pressure and heart rate Approach: midline Location: L3-L4 Injection technique: LOR air  Needle:  Needle type: Tuohy  Needle gauge: 17 G Needle length: 9 cm Needle insertion depth: 6 cm Catheter type: closed end flexible Catheter size: 19 Gauge Catheter at skin depth: 12 cm Test dose: negative (1% lidocaine)  Additional Notes LOR 6cm Cath 12cm skin Pt identified in Labor room.  Monitors applied. Working IV access confirmed. Sterile prep, drape lumbar spine.  1% lido local L 3,4.  #17ga Touhy LOR air 6cm, cath in easily 12cm skin.  1% lido test ok, cath dosed, infusion begun. Patient asymptomatic, VSS, no heme aspirated, tolerated well.  Sandford Craze Jymir Dunaj, MD Reason for block:procedure for pain

## 2015-10-26 NOTE — Anesthesia Postprocedure Evaluation (Signed)
Anesthesia Post Note  Patient: Katherine Hardy  Procedure(s) Performed: * No procedures listed *  Patient location during evaluation: Mother Baby Anesthesia Type: Epidural Level of consciousness: oriented and awake and alert Pain management: pain level controlled Vital Signs Assessment: post-procedure vital signs reviewed and stable Respiratory status: spontaneous breathing and nonlabored ventilation Cardiovascular status: stable Postop Assessment: epidural receding, patient able to bend at knees, no signs of nausea or vomiting and adequate PO intake Anesthetic complications: no     Last Vitals:  Filed Vitals:   10/26/15 1207 10/26/15 1225  BP: 124/73   Pulse: 88   Temp:    Resp: 18 20    Last Pain:  Filed Vitals:   10/26/15 1243  PainSc: 4    Pain Goal:                 Izetta Sakamoto Hristova

## 2015-10-26 NOTE — H&P (Signed)
Katherine Hardy is a 25 y.o. female presenting for SROM at term. GBS neg  Maternal Medical History:  Reason for admission: Rupture of membranes.   Contractions: Onset was less than 1 hour ago.   Frequency: rare.   Perceived severity is mild.    Fetal activity: Perceived fetal activity is normal.   Last perceived fetal movement was within the past hour.    Prenatal complications: no prenatal complications Prenatal Complications - Diabetes: none.    OB History    Gravida Para Term Preterm AB TAB SAB Ectopic Multiple Living   1              Past Medical History  Diagnosis Date  . Anxiety    Past Surgical History  Procedure Laterality Date  . Wisdom tooth extraction     Family History: family history includes Diabetes in her paternal grandfather; Heart disease (age of onset: 4460) in her paternal grandfather; Hyperlipidemia in her maternal grandfather and maternal grandmother; Hypertension in her mother; Macular degeneration in her maternal grandmother. There is no history of Cancer. Social History:  reports that she has never smoked. She has never used smokeless tobacco. She reports that she drinks alcohol. She reports that she does not use illicit drugs.   Prenatal Transfer Tool  Maternal Diabetes: No Genetic Screening: Normal Maternal Ultrasounds/Referrals: Normal Fetal Ultrasounds or other Referrals:  None Maternal Substance Abuse:  No Significant Maternal Medications:  None Significant Maternal Lab Results:  None Other Comments:  None  Review of Systems  Constitutional: Negative.   All other systems reviewed and are negative.   Dilation: 4.5 Effacement (%): 70 Station: -1 Exam by:: B. Parks, RN Blood pressure 129/85, pulse 95, temperature 97.8 F (36.6 C), temperature source Oral, resp. rate 16, height 5\' 2"  (1.575 m), weight 97.07 kg (214 lb), last menstrual period 11/27/2014. Maternal Exam:  Uterine Assessment: Contraction strength is mild.  Contraction  frequency is rare.   Abdomen: Patient reports no abdominal tenderness. Fetal presentation: vertex  Introitus: Normal vulva. Normal vagina.  Ferning test: positive.  Nitrazine test: positive. Amniotic fluid character: clear.  Pelvis: questionable for delivery.   Cervix: Cervix evaluated by digital exam.     Physical Exam  Nursing note and vitals reviewed. Constitutional: She is oriented to person, place, and time. She appears well-developed and well-nourished.  HENT:  Head: Normocephalic and atraumatic.  Neck: Neck supple.  Respiratory: Effort normal and breath sounds normal.  GI: Soft. Bowel sounds are normal.  Genitourinary: Vagina normal and uterus normal.  Musculoskeletal: Normal range of motion.  Neurological: She is alert and oriented to person, place, and time. She has normal reflexes.  Skin: Skin is warm and dry.  Psychiatric: She has a normal mood and affect.    Prenatal labs: ABO, Rh: --/--/O POS (06/29 2055) Antibody: NEG (06/29 2055) Rubella: Immune (11/28 0000) RPR: Nonreactive (11/28 0000)  HBsAg: Negative (11/28 0000)  HIV: Non-reactive (11/28 0000)  GBS: Negative (06/19 0000)   Assessment/Plan: Term IUP with SROM at 1900- gBS neg Presumed LGA Declines augmentation   Jannett Schmall J 10/26/2015, 1:00 AM

## 2015-10-26 NOTE — Progress Notes (Signed)
Katherine Hardy is a 25 y.o. G1P0 at 2241w5d by LMP admitted for rupture of membranes  Subjective: Comfortable with epidural  Objective: BP 122/62 mmHg  Pulse 78  Temp(Src) 97.4 F (36.3 C) (Oral)  Resp 17  Ht 5\' 2"  (1.575 m)  Wt 97.07 kg (214 lb)  BMI 39.13 kg/m2  SpO2 98%  LMP 11/27/2014      FHT:  FHR: 155 bpm, variability: moderate,  accelerations:  Present,  decelerations:  Absent UC:   irregular, every 2-5 minutes SVE:   Dilation: 6 Effacement (%): 80, 90 Station: 0 Exam by:: Katherine BurtonEmily Rothermel RN   Labs: Lab Results  Component Value Date   WBC 9.6 10/25/2015   HGB 12.9 10/25/2015   HCT 37.1 10/25/2015   MCV 84.3 10/25/2015   PLT 211 10/25/2015    Assessment / Plan: Spontaneous labor, progressing normally- contractions now irregular Will proceed with augmentation- pt in agreement  Labor: Progressing normally Preeclampsia:  no signs or symptoms of toxicity Fetal Wellbeing:  Category I Pain Control:  Epidural I/D:  n/a Anticipated MOD:  guarded  Katherine Hardy J 10/26/2015, 6:11 AM

## 2015-10-26 NOTE — Anesthesia Pain Management Evaluation Note (Signed)
  CRNA Pain Management Visit Note  Patient: Katherine Hardy, 25 y.o., female  "Hello I am a [facility] the [facility]. We have an anesthesia team available at all times to provide care throughout the hospital, including epidural management and anesthesia for C-section. I don't know your plan for the delivery whether it a natural birth, water birth, IV sedation, nitrous supplementation, doula or epidural, but we want to meet your pain goals."   1.Was your pain managed to your expectations on prior hospitalizations?   epidural  2.What is your expectation for pain management during this hospitalization?     epidural  3.How can we help you reach that goal? epidural  Record the patient's initial score and the patient's pain goal.   Pain: 0/10  Pain Goal: 0/10 [facility] wants you to be able to say your pain was always managed very well.  [name], [name] 10/26/2015

## 2015-10-27 LAB — CBC
HCT: 31.6 % — ABNORMAL LOW (ref 36.0–46.0)
Hemoglobin: 10.9 g/dL — ABNORMAL LOW (ref 12.0–15.0)
MCH: 29.5 pg (ref 26.0–34.0)
MCHC: 34.5 g/dL (ref 30.0–36.0)
MCV: 85.6 fL (ref 78.0–100.0)
PLATELETS: 151 10*3/uL (ref 150–400)
RBC: 3.69 MIL/uL — AB (ref 3.87–5.11)
RDW: 13.4 % (ref 11.5–15.5)
WBC: 11.3 10*3/uL — ABNORMAL HIGH (ref 4.0–10.5)

## 2015-10-27 LAB — RPR: RPR: NONREACTIVE

## 2015-10-27 MED ORDER — BENZOCAINE-MENTHOL 20-0.5 % EX AERO: 1.0000 "application " | INHALATION_SPRAY | CUTANEOUS | Status: DC | PRN

## 2015-10-27 MED ORDER — WITCH HAZEL-GLYCERIN EX PADS: 1.0000 "application " | MEDICATED_PAD | CUTANEOUS | Status: DC | PRN

## 2015-10-27 MED ORDER — PRENATAL MULTIVITAMIN CH: 1.0000 | ORAL_TABLET | Freq: Every day | ORAL | Status: AC

## 2015-10-27 MED ORDER — DIBUCAINE 1 % RE OINT: 1.0000 "application " | TOPICAL_OINTMENT | RECTAL | Status: DC | PRN

## 2015-10-27 MED ORDER — COCONUT OIL OIL: 1.0000 "application " | TOPICAL_OIL | Status: DC | PRN

## 2015-10-27 MED ORDER — IBUPROFEN 600 MG PO TABS: 600.0000 mg | ORAL_TABLET | Freq: Four times a day (QID) | ORAL | Status: DC

## 2015-10-27 NOTE — Progress Notes (Signed)
Patient ID: Liana Geroldrin Newton Malinak, female   DOB: 03/07/1991, 25 y.o.   MRN: [id number] PPD 1 SVD  S:  Reports feeling well, desires dc home.             Tolerating po/ No nausea or vomiting             Bleeding is light, decreasing.             Pain controlled with NSAID             Up ad lib / ambulatory / voiding freely.   Newborn  Information for the patient's newborn:  Devonne DoughtyHanes, Boy Cortni [[id number]][[id number]]  female  "Mayra ReelHunter Levi" breast feeding w/o difficulty  / Circumcision completed   O:  A & O x 3 NAD             VS:  Filed Vitals:   10/26/15 1346 10/26/15 1700 10/27/15 0008 10/27/15 0613  BP: 117/69 116/70 112/66 133/66  Pulse: 96 83 94 69  Temp: 97.6 F (36.4 C) 97.7 F (36.5 C) 97.7 F (36.5 C) 97.9 F (36.6 C)  TempSrc: Oral Oral Oral   Resp: 18 18 18    Height:      Weight:      SpO2:  98% 97%     LABS:  Recent Labs  10/25/15 2055 10/27/15 0553  WBC 9.6 11.3*  HGB 12.9 10.9*  HCT 37.1 31.6*  PLT 211 151    Blood type: --/--/O POS, O POS (06/29 2055)  Rubella: Immune (11/28 0000)   I&O: I/O last 3 completed shifts: In: -  Out: 875 [Urine:775; Blood:100]        Abdomen: soft, non-tender, non-distended              Fundus: firm, non-tender, U -2  Perineum: mild edema, repair intact, +hemorrhoids/not inflamed  Lochia: small  Extremities: +1 edema, no calf pain or tenderness    A/P: PPD # 1 25 y.o., G1P1001    Principal Problem:   Postpartum care following vaginal delivery (6/30)   Doing well - stable status  Routine post partum orders  DC home today  Neta MendsDaniela C Paul, CNM 10/27/2015, 12:42 PM

## 2015-10-27 NOTE — Lactation Note (Signed)
This note was copied from a baby's chart. Lactation Consultation Note  Patient Name: Katherine Hardy ZOXWR'UToday's Date: 10/27/2015 Reason for consult: Follow-up assessment   Follow up with first time mom of 23 hour old infant. Infant with 6 BF for 15-28 minutes, 3 attempts, 7 voids and 5 stools since birth. Infant weight 7 lb 6.5 oz with weight loss of 2 % since birth. LATCH scores 7-8 by bedside RN.   Mom reports infant is feeding well. She reports some nipple tenderness with initial latch. Infant is out being circumcised, discussed normalcy of being sleepy for a period of time post circumcision, enc mom to practice STS when he returns and feed at feeding cues. Enc mom to feed infant 8-12 x in 24 hours at first feeding cues. Family hopes to be d/c home today depending on bilirubin level.   Mom received PIS on the go tote as [employer] Employee. Reviewed all BF information in Taking Care of Baby and Me Booklet. Reviewed BF basics, Engorgement prevention treatment. Prevention, comfort pumping and pre pumping to soften areola. Reviewed I/O and maintaining feeding log and taking to Ped appt. Reviewed nipple care with mom. Reviewed LC Brochure, mom aware of OP services, BF Support Groups and LC phone #. Enc mom to call with questions/concerns prn. Mom with no questions at this time.      Maternal Data Formula Feeding for Exclusion: No Has patient been taught Hand Expression?: Yes Does the patient have breastfeeding experience prior to this delivery?: No  Feeding Feeding Type: Breast Fed  LATCH Score/Interventions                      Lactation Tools Discussed/Used WIC Program: No Pump Review: Milk Storage   Consult Status Consult Status: Complete Follow-up type: Call as needed    Ed BlalockSharon S Natalie Leclaire 10/27/2015, 9:28 AM

## 2015-10-27 NOTE — Clinical Social Work Maternal (Signed)
CLINICAL SOCIAL WORK MATERNAL/CHILD NOTE  Patient Details  Name: Katherine Hardy MRN: [id number] Date of Birth: 10/26/2017  Date:  10/27/2015  Clinical Social Worker Initiating Note:  Treena Cosman Smart, LCSW Date/ Time Initiated:  10/27/15/1430     Child's Name:  Katherine Hardy   Legal Guardian:  Mother   Need for Interpreter:  None   Date of Referral:  10/26/15     Reason for Referral:   (hx of anxiety)   Referral Source:  Physician   Address:  [address]; [place], [place] [address]  Phone number:  [phone]   Household Members:  Spouse   Natural Supports (not living in the home):  Community, Extended Family, Friends, Immediate Family, Parent, Spouse/significant other   Professional Supports: None   Employment: Full-time   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    Education:  College graduate   Financial Resources:  Private Insurance ([employer] Employee)   Other Resources:    n/a   Cultural/Religious Considerations Which May Impact Care:  none identified by patient or her family   Strengths:  Ability to meet basic needs , Compliance with medical plan , Home prepared for child , Pediatrician chosen , Understanding of illness   Risk Factors/Current Problems:  None   Cognitive State:  Alert , Insightful , Linear Thinking    Mood/Affect:  Happy , Bright , Calm    CSW Assessment: Patient is 25 year old female living in [place], [place] with her husband. She gave birth to a healthy baby Katherine on 10/25/15. Patient reports that she plans to return home today with her son and husband. 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 Patient plans to take 12 weeks maternity leave. She reports multiple family supports including her mother; husband; in-laws, extended family, and friends in the community. Patient reports a history "several years ago" of anxiety and states that she was not prescribed medication  for anxiety. Patient is aware of symptoms of anxiety/depression/mood instability and feels comfortable in speaking with her OBGYN or PCP about any concerns, should they arise. Patient reports no obstacles to discharge and no concerns at this time. "I'm ready to go home." Patient is pleasant and calm during assessment. FOB and pt's mother were also present in the room. FOB assisted in answering some questions during assessment and was helping pt's mother with calming their baby, who was fussy and crying at the time. Patient was given a "Feelings After Birth" pamphlet and encouraged to attend a meeting if she was interested. Patient thanked CSW.   CSW Plan/Description:  No Further Intervention Required/No Barriers to Discharge    Smart, Reford Olliff, LCSW 10/27/2015, 2:32 PM  

## 2015-10-27 NOTE — Discharge Summary (Signed)
Obstetric Discharge Summary Reason for Admission: onset of labor and rupture of membranes Prenatal Procedures: ultrasound Intrapartum Procedures: spontaneous vaginal delivery Postpartum Procedures: none Complications-Operative and Postpartum: 2nd degree perineal laceration HEMOGLOBIN  Date Value Ref Range Status  10/27/2015 10.9* 12.0 - 15.0 g/dL Final   HCT  Date Value Ref Range Status  10/27/2015 31.6* 36.0 - 46.0 % Final    Physical Exam:  General: alert, cooperative and no distress Lochia: appropriate Uterine Fundus: firm Incision: healing well DVT Evaluation: No evidence of DVT seen on physical exam.  Discharge Diagnoses: Term Pregnancy-delivered  Discharge Information: Date: 10/27/2015 Activity: pelvic rest Diet: routine Medications: PNV, Ibuprofen and Colace Condition: stable Instructions: refer to practice specific booklet Discharge to: home Follow-up Information    Schedule an appointment as soon as possible for a visit with Lenoard AdenAAVON,RICHARD J, MD.   Specialty:  Obstetrics and Gynecology   Why:  Postpartum visit   Contact information:   [address] [place] [id number] [phone]       Newborn Data: Live born female  Birth Weight: 7 lb 8.8 oz (3425 g) APGAR: 9, 9  Home with mother.  Neta MendsDaniela C Krayton Wortley, CNM 10/27/2015, 12:50 PM

## 2015-12-12 DIAGNOSIS — Z30430 Encounter for insertion of intrauterine contraceptive device: Secondary | ICD-10-CM | POA: Diagnosis not present

## 2016-01-02 DIAGNOSIS — N938 Other specified abnormal uterine and vaginal bleeding: Secondary | ICD-10-CM | POA: Diagnosis not present

## 2016-04-24 ENCOUNTER — Emergency Department (HOSPITAL_COMMUNITY): Payer: 59

## 2016-04-24 ENCOUNTER — Emergency Department (HOSPITAL_COMMUNITY)
Admission: EM | Admit: 2016-04-24 | Discharge: 2016-04-24 | Disposition: A | Discharge status: Home / Self Care | Payer: 59 | Attending: Emergency Medicine | Admitting: Emergency Medicine

## 2016-04-24 ENCOUNTER — Encounter (HOSPITAL_COMMUNITY): Payer: Self-pay | Admitting: Pharmacy Technician

## 2016-04-24 DIAGNOSIS — R0603 Acute respiratory distress: Secondary | ICD-10-CM | POA: Diagnosis present

## 2016-04-24 DIAGNOSIS — Z79899 Other long term (current) drug therapy: Secondary | ICD-10-CM | POA: Insufficient documentation

## 2016-04-24 DIAGNOSIS — J181 Lobar pneumonia, unspecified organism: Secondary | ICD-10-CM

## 2016-04-24 DIAGNOSIS — J189 Pneumonia, unspecified organism: Secondary | ICD-10-CM | POA: Insufficient documentation

## 2016-04-24 LAB — BASIC METABOLIC PANEL
Anion gap: 10 (ref 5–15)
BUN: 15 mg/dL (ref 6–20)
CHLORIDE: 109 mmol/L (ref 101–111)
CO2: 22 mmol/L (ref 22–32)
Calcium: 8.8 mg/dL — ABNORMAL LOW (ref 8.9–10.3)
Creatinine, Ser: 0.79 mg/dL (ref 0.44–1.00)
GFR calc Af Amer: >60 mL/min (ref >60)
GFR calc non Af Amer: >60 mL/min (ref >60)
GLUCOSE: 123 mg/dL — AB (ref 65–99)
POTASSIUM: 3.3 mmol/L — AB (ref 3.5–5.1)
Sodium: 141 mmol/L (ref 135–145)

## 2016-04-24 LAB — CBC WITH DIFFERENTIAL/PLATELET
Basophils Absolute: 0 10*3/uL (ref 0.0–0.1)
Basophils Relative: 0 %
EOS PCT: 1 %
Eosinophils Absolute: 0.1 10*3/uL (ref 0.0–0.7)
HCT: 40 % (ref 36.0–46.0)
Hemoglobin: 13.7 g/dL (ref 12.0–15.0)
LYMPHS ABS: 2.6 10*3/uL (ref 0.7–4.0)
LYMPHS PCT: 41 %
MCH: 28.9 pg (ref 26.0–34.0)
MCHC: 34.3 g/dL (ref 30.0–36.0)
MCV: 84.4 fL (ref 78.0–100.0)
MONO ABS: 0.4 10*3/uL (ref 0.1–1.0)
Monocytes Relative: 6 %
Neutro Abs: 3.3 10*3/uL (ref 1.7–7.7)
Neutrophils Relative %: 52 %
PLATELETS: 242 10*3/uL (ref 150–400)
RBC: 4.74 MIL/uL (ref 3.87–5.11)
RDW: 12 % (ref 11.5–15.5)
WBC: 6.3 10*3/uL (ref 4.0–10.5)

## 2016-04-24 LAB — I-STAT TROPONIN, ED: Troponin i, poc: 0 ng/mL (ref 0.00–0.08)

## 2016-04-24 LAB — D-DIMER, QUANTITATIVE: D-Dimer, Quant: 0.59 ug/mL-FEU — ABNORMAL HIGH (ref 0.00–0.50)

## 2016-04-24 LAB — I-STAT BETA HCG BLOOD, ED (MC, WL, AP ONLY): I-stat hCG, quantitative: <5 m[IU]/mL (ref <5)

## 2016-04-24 MED ORDER — IPRATROPIUM-ALBUTEROL 0.5-2.5 (3) MG/3ML IN SOLN
3.0000 mL | Freq: Once | RESPIRATORY_TRACT | Status: AC
  Administered 2016-04-24: 3 mL via RESPIRATORY_TRACT
  Filled 2016-04-24: qty 3

## 2016-04-24 MED ORDER — IOPAMIDOL (ISOVUE-370) INJECTION 76%
INTRAVENOUS | Status: AC
Start: 2016-04-24 — End: 2016-04-24
  Administered 2016-04-24: 100 mL
  Filled 2016-04-24: qty 100

## 2016-04-24 MED ORDER — AZITHROMYCIN 250 MG PO TABS: 250.0000 mg | ORAL_TABLET | Freq: Every day | ORAL | 0 refills | Status: DC

## 2016-04-24 NOTE — ED Notes (Signed)
Gave pt Diet Coke per Crystal(RN)

## 2016-04-24 NOTE — ED Notes (Signed)
Pt transported to CT ?

## 2016-04-24 NOTE — ED Notes (Signed)
ED Provider at bedside. 

## 2016-04-24 NOTE — ED Triage Notes (Signed)
Pt reports to the ED with co difficulty breathing. Pt went to her PCP and was sent here to rule out PE. Pt denies fever/cough. Pt states it is harder to breathe in than it should be and states it is easier to breathe sitting up.

## 2016-04-24 NOTE — ED Provider Notes (Signed)
[facility]-EMERGENCY DEPT Provider Note   CSN: [id number] Arrival date & time: 04/24/16 1510     History    Chief Complaint  Patient presents with  . Respiratory Distress     HPI Katherine Hardy is a 25 y.o. female.  25yo healthy female 6 months post-partum who p/w shortness of breath. Patient reports a 5 day history of progressively worsening difficulty breathing. She describes her symptoms as "trying to breathe through water," or the burning/tightness feeling of breathing in cold air when exercising outside. The shortness of breath feels better when sitting up and worse with exertion. She states that she had a similar feeling when she had pneumonia last year but this time she has had no cough, fever, or URI symptoms. No running, diarrhea, or recent illness. No recent travel, leg swelling/pain, OCP use, history of cancer, or history of blood clots. The patient is a nonsmoker and is no family history of early heart disease or blood clots. She was sent here from urgent care for further evaluation.    Past Medical History:  Diagnosis Date  . Anxiety   . Postpartum care following vaginal delivery (6/30) 10/26/2015     Patient Active Problem List   Diagnosis Date Noted  . Postpartum care following vaginal delivery (6/30) 10/26/2015  . Fever 11/29/2014  . Anxiety state 07/11/2014    Past Surgical History:  Procedure Laterality Date  . WISDOM TOOTH EXTRACTION      OB History    Gravida Para Term Preterm AB Living   1 1 1     1    SAB TAB Ectopic Multiple Live Births         0 1        Home Medications    Prior to Admission medications   Medication Sig Start Date End Date Taking? Authorizing Provider  acetaminophen (TYLENOL) 500 MG tablet Take 500 mg by mouth every 6 (six) hours as needed for mild pain or headache.    Historical Provider, MD  azithromycin (ZITHROMAX) 250 MG tablet Take 1 tablet (250 mg total) by mouth daily. Take first 2 tablets together, then 1 every  day until finished. 04/24/16   Ambrose Finlandachel Morgan Little, MD  benzocaine-Menthol (DERMOPLAST) 20-0.5 % AERO Apply 1 application topically as needed for irritation (perineal discomfort). 10/27/15   Neta Mendsaniela C Paul, CNM  calcium carbonate (TUMS EX) 750 MG chewable tablet Chew 2 tablets by mouth 4 (four) times daily as needed for heartburn.    Historical Provider, MD  coconut oil OIL Apply 1 application topically as needed. 10/27/15   Neta Mendsaniela C Paul, CNM  dibucaine (NUPERCAINAL) 1 % OINT Place 1 application rectally as needed for hemorrhoids. 10/27/15   Neta Mendsaniela C Paul, CNM  ibuprofen (ADVIL,MOTRIN) 600 MG tablet Take 1 tablet (600 mg total) by mouth every 6 (six) hours. 10/27/15   Neta Mendsaniela C Paul, CNM  polyethylene glycol (MIRALAX / GLYCOLAX) packet Take 17 g by mouth daily as needed.    Historical Provider, MD  Prenatal Vit-Fe Fumarate-FA (PRENATAL MULTIVITAMIN) TABS tablet Take 1 tablet by mouth daily at 12 noon. 10/27/15   Neta Mendsaniela C Paul, CNM  witch hazel-glycerin (TUCKS) pad Apply 1 application topically as needed for hemorrhoids. 10/27/15   Neta Mendsaniela C Paul, CNM      Family History  Problem Relation Age of Onset  . Hypertension Mother   . Hyperlipidemia Maternal Grandmother   . Macular degeneration Maternal Grandmother   . Hyperlipidemia Maternal Grandfather   . Heart disease Paternal Grandfather  60    CABG/stent  . Diabetes Paternal Grandfather   . Cancer Neg Hx      Social History  Substance Use Topics  . Smoking status: Never Smoker  . Smokeless tobacco: Never Used  . Alcohol use Yes     Comment: social     Allergies     Ppd [tuberculin purified protein derivative] and Sulfa antibiotics    Review of Systems  10 Systems reviewed and are negative for acute change except as noted in the HPI.   Physical Exam Updated Vital Signs BP 117/60 (BP Location: Left Arm) Comment: Simultaneous filing. User may not have seen previous data.  Pulse 96 Comment: Simultaneous filing. User may not have  seen previous data.  Temp 97.6 F (36.4 C) (Oral)   Resp 20 Comment: Simultaneous filing. User may not have seen previous data.  LMP  (LMP Unknown)   SpO2 100% Comment: Simultaneous filing. User may not have seen previous data.  Physical Exam  Constitutional: She is oriented to person, place, and time. She appears well-developed and well-nourished. No distress.  HENT:  Head: Normocephalic and atraumatic.  Moist mucous membranes  Eyes: Conjunctivae are normal. Pupils are equal, round, and reactive to light.  Neck: Neck supple.  Cardiovascular: Normal rate, regular rhythm and normal heart sounds.   No murmur heard. Pulmonary/Chest: Effort normal and breath sounds normal.  Abdominal: Soft. Bowel sounds are normal. She exhibits no distension. There is no tenderness.  Musculoskeletal: She exhibits no edema or tenderness.  Neurological: She is alert and oriented to person, place, and time.  Fluent speech  Skin: Skin is warm and dry.  Psychiatric: She has a normal mood and affect. Judgment normal.  Nursing note and vitals reviewed.     ED Treatments / Results  Labs (all labs ordered are listed, but only abnormal results are displayed) Labs Reviewed  BASIC METABOLIC PANEL - Abnormal; Notable for the following:       Result Value   Potassium 3.3 (*)    Glucose, Bld 123 (*)    Calcium 8.8 (*)    All other components within normal limits  D-DIMER, QUANTITATIVE (NOT [facility]) - Abnormal; Notable for the following:    D-Dimer, Quant 0.59 (*)    All other components within normal limits  CBC WITH DIFFERENTIAL/PLATELET  I-STAT BETA HCG BLOOD, ED ([facility], [facility], [facility] ONLY)  I-STAT TROPOININ, ED     EKG  EKG Interpretation  Date/Time:  Thursday April 24 2016 15:44:00 EST Ventricular Rate:  86 PR Interval:    QRS Duration: 92 QT Interval:  358 QTC Calculation: 429 R Axis:   6 Text Interpretation:  Sinus rhythm RSR' in V1 or V2, probably normal variant No previous ECGs available  Confirmed by LITTLE MD, RACHEL [phone]([id number]) on 04/24/2016 3:54:36 PM Also confirmed by LITTLE MD, RACHEL [phone]), editor [place] CT, Jola BabinskiMarilyn [phone]([id number])  on 04/24/2016 4:38:28 PM         Radiology Ct Angio Chest Pe W/cm &/or Wo Cm  Result Date: 04/24/2016 CLINICAL DATA:  Shortness of breath and elevated D-dimer. EXAM: CT ANGIOGRAPHY CHEST WITH CONTRAST TECHNIQUE: Multidetector CT imaging of the chest was performed using the standard protocol during bolus administration of intravenous contrast. Multiplanar CT image reconstructions and MIPs were obtained to evaluate the vascular anatomy. CONTRAST:  100 cc Isovue 370 COMPARISON:  None. FINDINGS: Chest wall: No breast masses, supraclavicular or axillary lymphadenopathy. The thyroid gland appears normal. Cardiovascular: The heart is normal in size. No pericardial effusion.  The aorta is normal in caliber. No dissection. The branch vessels are patent. The pulmonary arterial tree is well opacified. No filling defects to suggest pulmonary embolism. Mediastinum/Nodes: Residual thymic tissue is noted in the anterior mediastinum. No mediastinal or hilar mass or adenopathy. The esophagus is grossly normal. Lungs/Pleura: Left upper lobe pneumonia is demonstrated. No worrisome lesions. Dependent atelectasis/edema is noted. No pleural effusion. Upper Abdomen: No significant findings. Musculoskeletal: No significant bony findings. Review of the MIP images confirms the above findings. IMPRESSION: 1. No CT findings for pulmonary embolism. 2. Normal thoracic aorta. 3. Left upper lobe pneumonia. Electronically Signed   By: Rudie Meyer M.D.   On: 04/24/2016 18:57   Dg Chest Portable 1 View  Result Date: 04/24/2016 CLINICAL DATA:  Shortness of breath. EXAM: PORTABLE CHEST 1 VIEW COMPARISON:  12/01/2014 FINDINGS: The heart size and mediastinal contours are within normal limits. Both lungs are clear. The visualized skeletal structures are unremarkable. IMPRESSION: Normal chest.  Electronically Signed   By: Francene Boyers M.D.   On: 04/24/2016 16:26    Procedures Procedures (including critical care time) Procedures  Medications Ordered in ED  Medications  ipratropium-albuterol (DUONEB) 0.5-2.5 (3) MG/3ML nebulizer solution 3 mL (3 mLs Nebulization Given 04/24/16 1630)  iopamidol (ISOVUE-370) 76 % injection (100 mLs  Contrast Given 04/24/16 1841)     Initial Impression / Assessment and Plan / ED Course  I have reviewed the triage vital signs and the nursing notes.  Pertinent labs & imaging results that were available during my care of the patient were reviewed by me and considered in my medical decision making (see chart for details).  Clinical Course     PT w/ 5 days of progressively worsening shortness of breath, worse w/ exertion, no associated infectious symptoms. No risk factors for heart disease or blood clots. Patient was well-appearing with normal vital signs on exam. Normal O2 saturation and normal work of breathing, no wheezing. EKG unremarkable. Obtained chest x-ray as well as above labwork including troponin and d-dimer.  Gave the patient a DuoNeb because she stated that it felt tight when breathing. No significant change in sx after breathing treatment. Trop and basic labs normal. D-dimer slightly elevated therefore obtained a CTA of chest which was negative for PE but does show left upper lobe pneumonia. The patient is well-appearing with normal work of breathing and normal O2 saturation on room air on reexamination therefore I feel she is safe for outpatient management with azithromycin. Discussed treatment plan and follow-up and reviewed return precautions. Patient voiced understanding and was discharged in satisfactory condition.  Final Clinical Impressions(s) / ED Diagnoses   Final diagnoses:  Pneumonia of left upper lobe due to infectious organism [facility])     New Prescriptions   AZITHROMYCIN (ZITHROMAX) 250 MG TABLET    Take 1 tablet (250 mg  total) by mouth daily. Take first 2 tablets together, then 1 every day until finished.       Laurence Spates, MD 04/24/16 [phone]

## 2016-04-28 ENCOUNTER — Telehealth: Payer: Self-pay | Admitting: Family

## 2016-04-28 NOTE — Telephone Encounter (Signed)
Needs ED follow up visit with me in 1 week please.

## 2016-05-01 NOTE — Telephone Encounter (Signed)
Called pt to schedule ED follow up. Pt says that she was seen and Dx with pneumonia. Pt says that she has completed her z-patch and is better now. Advised that per PCP she would need to follow up with her. Pt says that she doesn't need to follow up. She will call if needed.

## 2017-12-13 IMAGING — CT CT ANGIO CHEST
2 of 6 series · 18 of 36 positions shown · IV contrast (Omni 300)
Comparison: None.

CLINICAL DATA: Shortness of breath and elevated D-dimer.

EXAM:
CT ANGIOGRAPHY CHEST WITH CONTRAST
TECHNIQUE: Multidetector CT imaging of the chest was performed using the
standard protocol during bolus administration of intravenous
contrast. Multiplanar CT image reconstructions and MIPs were
obtained to evaluate the vascular anatomy.
CONTRAST:  100 cc Isovue 370

[Series 6: pe thins · axial · 0.74mm/px · z∈[+739,+949]mm · 17 of 238 slices shown]
[im 14/238  lung]
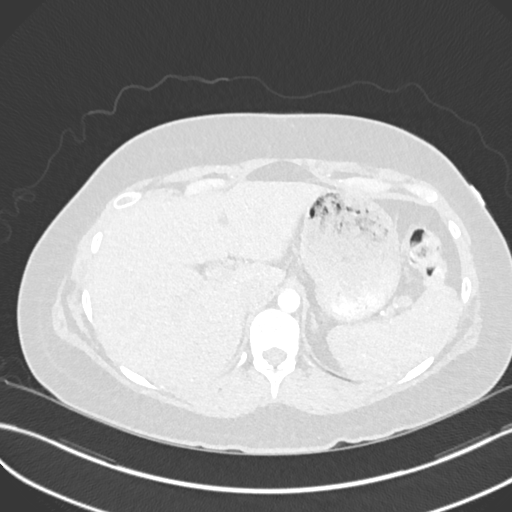
[im 27/238  mediastinal]
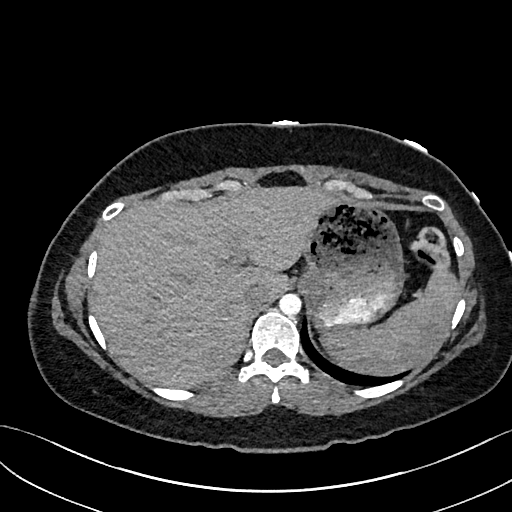
[im 40/238  lung]
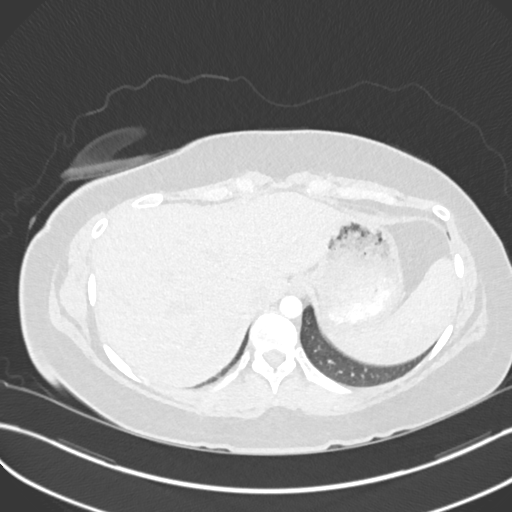
[im 53/238  mediastinal]
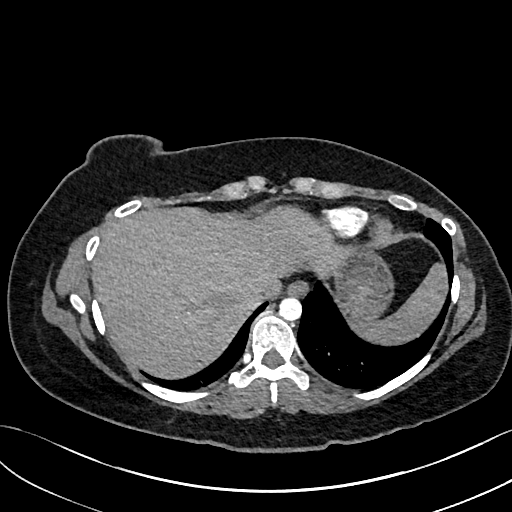
[im 66/238  lung]
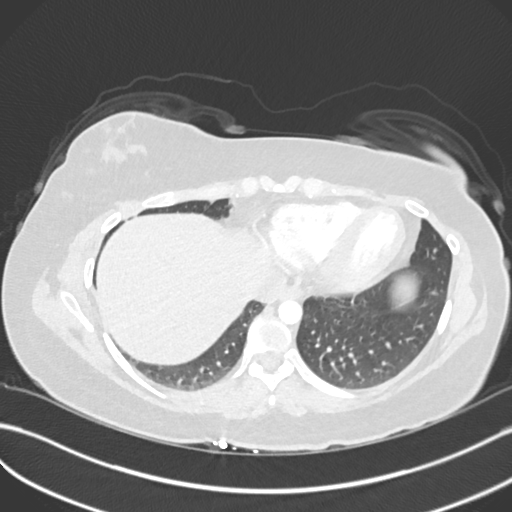
[im 80/238  mediastinal]
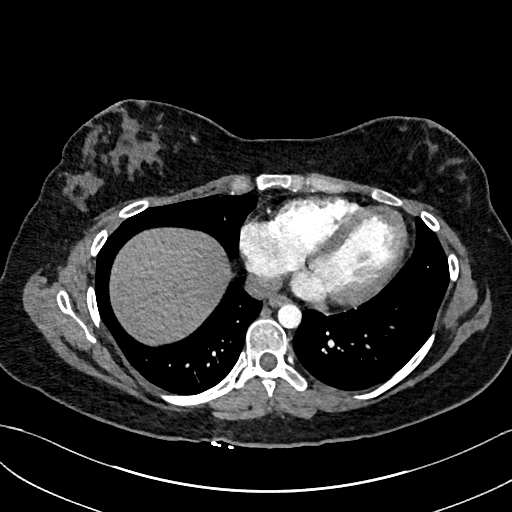
[im 93/238  lung]
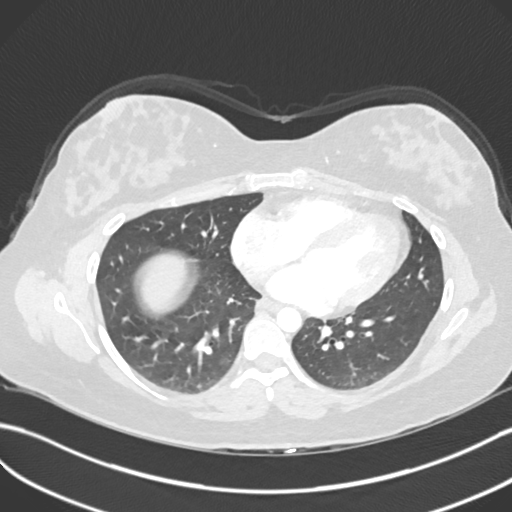
[im 106/238  mediastinal]
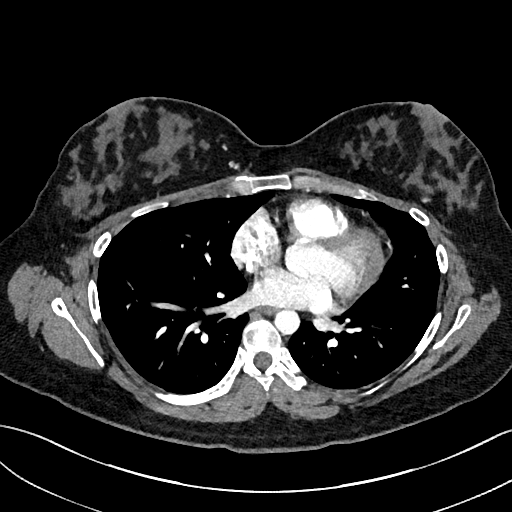
[im 119/238  lung]
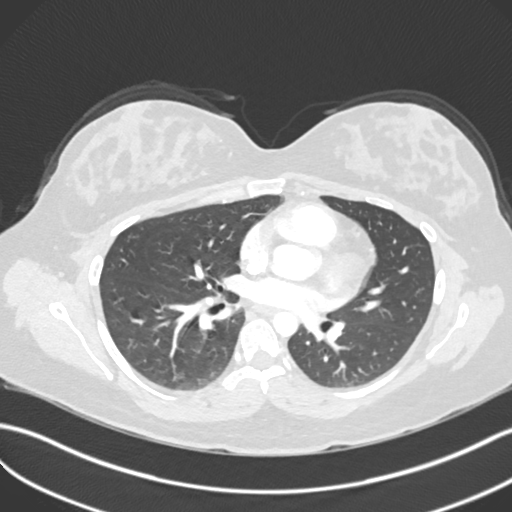
[im 132/238  mediastinal]
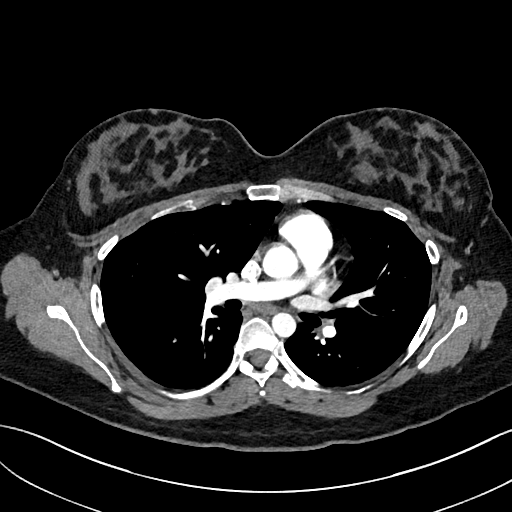
[im 145/238  lung]
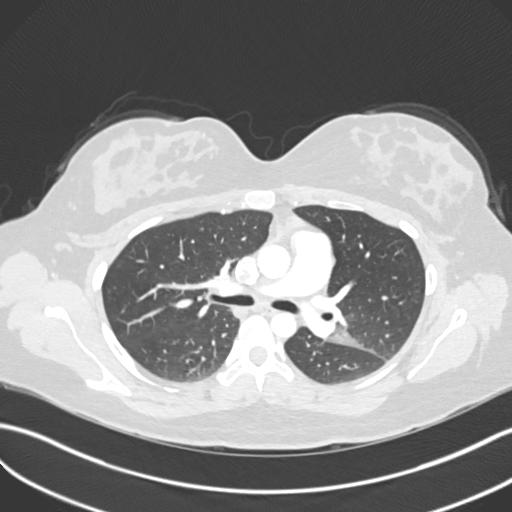
[im 159/238  mediastinal]
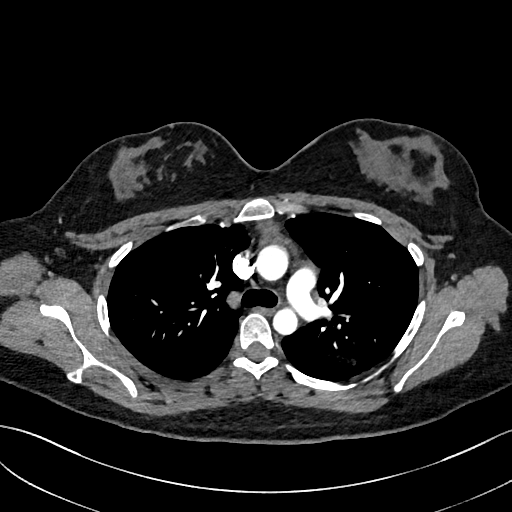
[im 172/238  lung]
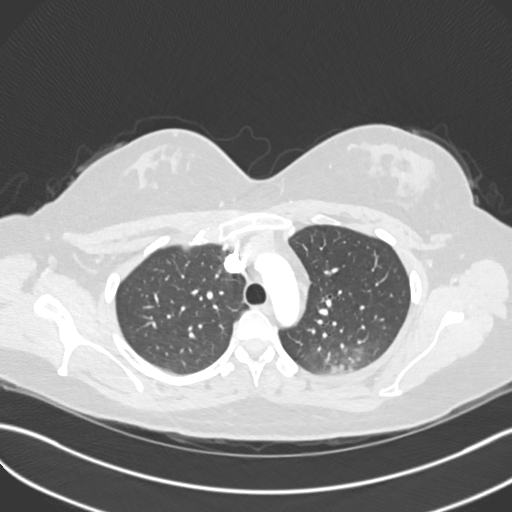
[im 185/238  mediastinal]
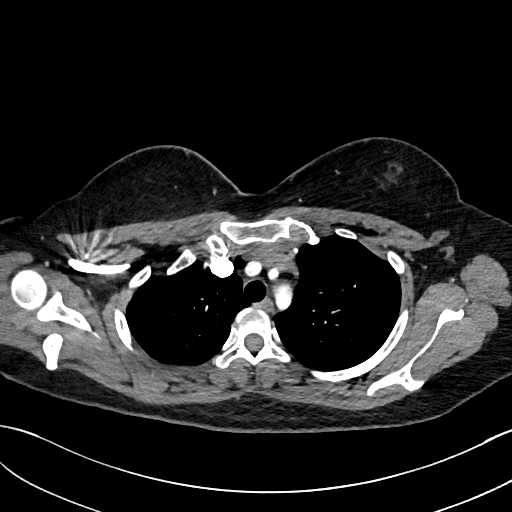
[im 198/238  lung]
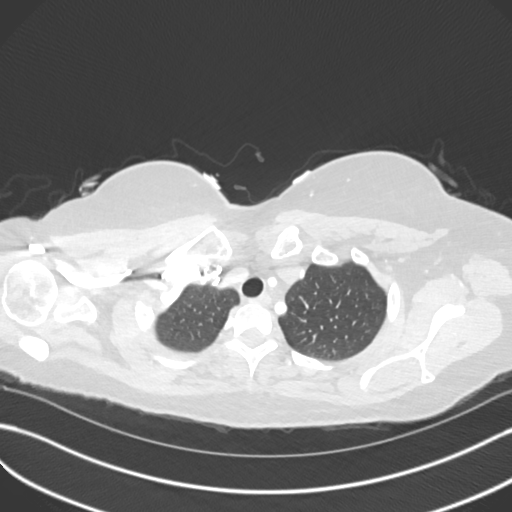
[im 211/238  mediastinal]
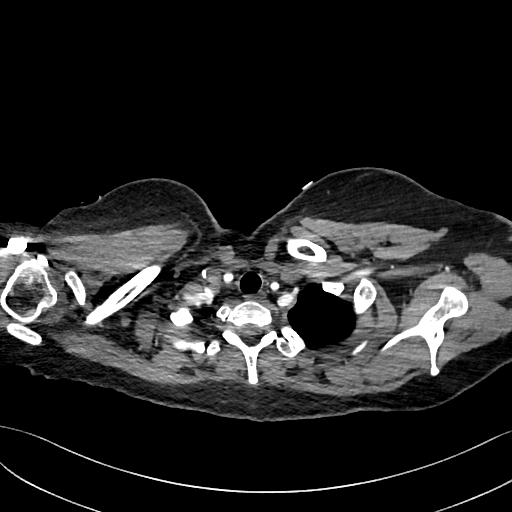
[im 224/238  lung]
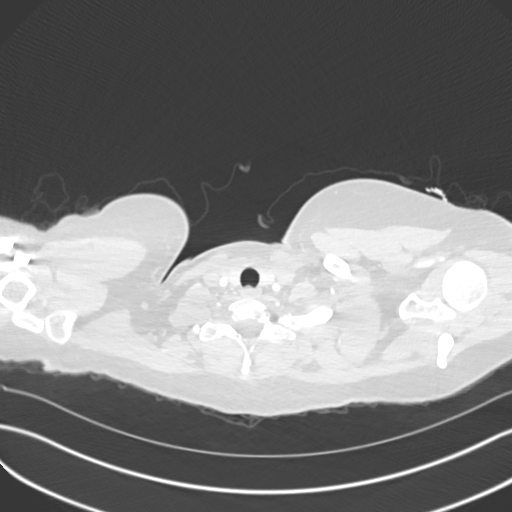

[Series 7: pe 2mm cor · coronal · 0.45mm/px · 1 of 123 slices shown]
[im 62/123  mediastinal]
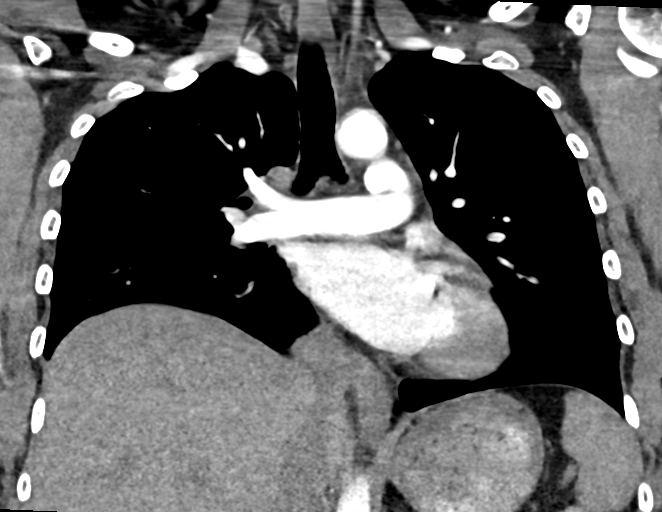

[18 of 36 positions shown; findings below may reference images not displayed]

FINDINGS: Chest wall: No breast masses, supraclavicular or axillary
lymphadenopathy. The thyroid gland appears normal.

Cardiovascular: The heart is normal in size. No pericardial
effusion. The aorta is normal in caliber. No dissection. The branch
vessels are patent.

The pulmonary arterial tree is well opacified. No filling defects to
suggest pulmonary embolism.

Mediastinum/Nodes: Residual thymic tissue is noted in the anterior
mediastinum. No mediastinal or hilar mass or adenopathy. The
esophagus is grossly normal.

Lungs/Pleura: Left upper lobe pneumonia is demonstrated. No
worrisome lesions. Dependent atelectasis/edema is noted. No pleural
effusion.

Upper Abdomen: No significant findings.

Musculoskeletal: No significant bony findings.

Review of the MIP images confirms the above findings.
IMPRESSION: 1. No CT findings for pulmonary embolism.
2. Normal thoracic aorta.
3. Left upper lobe pneumonia.

## 2018-04-28 NOTE — L&D Delivery Note (Signed)
Delivery Note At 1:08 PM a viable and healthy female was delivered via Vaginal, Spontaneous (Presentation: LOA  ).  APGAR: 9, 9; weight pending .   Placenta status: spontaneous, intact.  Cord: spontaneous with the following complications: none.  Cord pH: na  Anesthesia:  epidural Episiotomy: None Lacerations: 2nd degree Suture Repair: 2.0 vicryl rapide Est. Blood Loss (mL): 135  Mom to postpartum.  Baby to Couplet care / Skin to Skin.  Karelyn Brisby J 04/04/2019, 1:34 PM

## 2018-09-08 LAB — OB RESULTS CONSOLE ABO/RH: RH Type: POSITIVE

## 2018-09-08 LAB — OB RESULTS CONSOLE GC/CHLAMYDIA
Chlamydia: NEGATIVE
Gonorrhea: NEGATIVE

## 2018-09-08 LAB — OB RESULTS CONSOLE RPR: RPR: NONREACTIVE

## 2018-09-08 LAB — OB RESULTS CONSOLE RUBELLA ANTIBODY, IGM: Rubella: IMMUNE

## 2018-09-08 LAB — OB RESULTS CONSOLE ANTIBODY SCREEN: Antibody Screen: NEGATIVE

## 2018-09-08 LAB — OB RESULTS CONSOLE HIV ANTIBODY (ROUTINE TESTING): HIV: NONREACTIVE

## 2018-09-08 LAB — OB RESULTS CONSOLE HEPATITIS B SURFACE ANTIGEN: Hepatitis B Surface Ag: NEGATIVE

## 2019-03-11 LAB — OB RESULTS CONSOLE GBS: GBS: NEGATIVE

## 2019-03-21 ENCOUNTER — Other Ambulatory Visit (HOSPITAL_COMMUNITY)
Admission: RE | Admit: 2019-03-21 | Discharge: 2019-03-21 | Disposition: A | Discharge status: Home / Self Care | Payer: No Typology Code available for payment source | Source: Ambulatory Visit | Attending: Obstetrics and Gynecology | Admitting: Obstetrics and Gynecology

## 2019-03-21 ENCOUNTER — Encounter (HOSPITAL_COMMUNITY): Payer: Self-pay | Admitting: *Deleted

## 2019-03-21 ENCOUNTER — Telehealth (HOSPITAL_COMMUNITY): Payer: Self-pay | Admitting: *Deleted

## 2019-03-21 DIAGNOSIS — Z01812 Encounter for preprocedural laboratory examination: Secondary | ICD-10-CM | POA: Insufficient documentation

## 2019-03-21 DIAGNOSIS — Z20828 Contact with and (suspected) exposure to other viral communicable diseases: Secondary | ICD-10-CM | POA: Diagnosis not present

## 2019-03-21 NOTE — Telephone Encounter (Signed)
Preadmission screen  

## 2019-03-22 ENCOUNTER — Encounter (HOSPITAL_COMMUNITY): Payer: Self-pay | Admitting: *Deleted

## 2019-03-22 ENCOUNTER — Other Ambulatory Visit: Payer: Self-pay

## 2019-03-22 ENCOUNTER — Inpatient Hospital Stay (HOSPITAL_COMMUNITY): Payer: No Typology Code available for payment source

## 2019-03-22 ENCOUNTER — Ambulatory Visit (HOSPITAL_COMMUNITY)
Admission: AD | Admit: 2019-03-22 | Discharge: 2019-03-22 | Disposition: A | Discharge status: Home / Self Care | Payer: No Typology Code available for payment source | Attending: Obstetrics and Gynecology | Admitting: Obstetrics and Gynecology

## 2019-03-22 DIAGNOSIS — O321XX0 Maternal care for breech presentation, not applicable or unspecified: Secondary | ICD-10-CM | POA: Diagnosis not present

## 2019-03-22 LAB — CBC
HCT: 37.9 % (ref 36.0–46.0)
Hemoglobin: 12.9 g/dL (ref 12.0–15.0)
MCH: 29.7 pg (ref 26.0–34.0)
MCHC: 34 g/dL (ref 30.0–36.0)
MCV: 87.3 fL (ref 80.0–100.0)
Platelets: 208 10*3/uL (ref 150–400)
RBC: 4.34 MIL/uL (ref 3.87–5.11)
RDW: 12.7 % (ref 11.5–15.5)
WBC: 9.8 10*3/uL (ref 4.0–10.5)
nRBC: 0 % (ref 0.0–0.2)

## 2019-03-22 LAB — ABO/RH: ABO/RH(D): O POS

## 2019-03-22 LAB — TYPE AND SCREEN
ABO/RH(D): O POS
Antibody Screen: NEGATIVE

## 2019-03-22 LAB — SARS CORONAVIRUS 2 (TAT 6-24 HRS): SARS Coronavirus 2: NEGATIVE

## 2019-03-22 MED ORDER — SODIUM CHLORIDE 0.9% FLUSH: 3.0000 mL | Freq: Two times a day (BID) | INTRAVENOUS | Status: DC

## 2019-03-22 MED ORDER — TERBUTALINE SULFATE 1 MG/ML IJ SOLN
INTRAMUSCULAR | Status: AC
  Filled 2019-03-22: qty 1

## 2019-03-22 MED ORDER — TERBUTALINE SULFATE 1 MG/ML IJ SOLN
0.2500 mg | Freq: Once | INTRAMUSCULAR | Status: AC
  Administered 2019-03-22: 08:00:00 0.25 mg via SUBCUTANEOUS

## 2019-03-22 MED ORDER — SODIUM CHLORIDE 0.9 % IV SOLN: 250.0000 mL | INTRAVENOUS | Status: DC | PRN

## 2019-03-22 MED ORDER — SODIUM CHLORIDE 0.9% FLUSH: 3.0000 mL | INTRAVENOUS | Status: DC | PRN

## 2019-03-22 NOTE — Procedures (Signed)
Consent done.  BP 116/78 (BP Location: Right Arm)   Pulse (!) 102   Temp (!) 97.5 F (36.4 C)   Resp 18   Ht 5\' 2"  (1.575 m)   Wt 93 kg   SpO2 100%   BMI 37.49 kg/m  Category 1 tracing Terbutaline Norborne given Backward roll attempted x 2 unsuccessfully Forward roll successful FHT 145 post procedure DC with reactive NST Vertex confirmed. Successful ECV.   Labor precautions.

## 2019-03-22 NOTE — H&P (Signed)
Katherine Hardy is a 28 y.o. female presenting for ECV for breech presentation. OB History    Gravida  2   Para  1   Term  1   Preterm      AB      Living  1     SAB      TAB      Ectopic      Multiple  0   Live Births  1          Past Medical History:  Diagnosis Date  . Anxiety   . Postpartum care following vaginal delivery (6/30) 10/26/2015   Past Surgical History:  Procedure Laterality Date  . WISDOM TOOTH EXTRACTION     Family History: family history includes Diabetes in her paternal grandfather; Heart disease (age of onset: 74) in her paternal grandfather; Hyperlipidemia in her maternal grandfather and maternal grandmother; Hypertension in her mother; Macular degeneration in her maternal grandmother. Social History:  reports that she has never smoked. She has never used smokeless tobacco. She reports current alcohol use. She reports that she does not use drugs.     Maternal Diabetes: No Genetic Screening: Normal Maternal Ultrasounds/Referrals: Normal Fetal Ultrasounds or other Referrals:  None Maternal Substance Abuse:  No Significant Maternal Medications:  None Significant Maternal Lab Results:  Group B Strep negative Other Comments:  None  Review of Systems  Constitutional: Negative.   All other systems reviewed and are negative.  Maternal Medical History:  Contractions: Onset was less than 1 hour ago.   Perceived severity is mild.    Fetal activity: Perceived fetal activity is normal.   Last perceived fetal movement was within the past hour.    Prenatal complications: no prenatal complications Prenatal Complications - Diabetes: none.      unknown if currently breastfeeding. Maternal Exam:  Uterine Assessment: Contraction strength is mild.  Contraction frequency is rare.   Abdomen: Patient reports no abdominal tenderness. Fetal presentation: breech  Introitus: Normal vulva. Normal vagina.  Ferning test: not done.  Nitrazine test: not  done. Amniotic fluid character: not assessed.  Pelvis: adequate for delivery.   Cervix: Cervix evaluated by digital exam.     Physical Exam  Nursing note and vitals reviewed. Constitutional: She is oriented to person, place, and time. She appears well-developed and well-nourished.  HENT:  Head: Atraumatic.  Neck: Normal range of motion. Neck supple.  Cardiovascular: Normal rate and regular rhythm.  Respiratory: Effort normal and breath sounds normal.  GI: Soft. Bowel sounds are normal.  Genitourinary:    Vulva, vagina and uterus normal.   Musculoskeletal: Normal range of motion.  Neurological: She is alert and oriented to person, place, and time. She has normal reflexes.  Skin: Skin is warm.  Psychiatric: She has a normal mood and affect.    Confirmed breech by bedside sono.  Prenatal labs: ABO, Rh: O/Positive/-- (05/13 0000) Antibody: Negative (05/13 0000) Rubella: Immune (05/13 0000) RPR: Nonreactive (05/13 0000)  HBsAg: Negative (05/13 0000)  HIV: Non-reactive (05/13 0000)  GBS:     Assessment/Plan: Breech presentation For ECV Consent done.   Katherine Hardy 03/22/2019, 6:27 AM

## 2019-03-30 ENCOUNTER — Other Ambulatory Visit: Payer: Self-pay | Admitting: Advanced Practice Midwife

## 2019-03-30 ENCOUNTER — Other Ambulatory Visit: Payer: Self-pay | Admitting: Obstetrics and Gynecology

## 2019-03-31 ENCOUNTER — Encounter (HOSPITAL_COMMUNITY): Payer: Self-pay | Admitting: *Deleted

## 2019-04-01 ENCOUNTER — Telehealth (HOSPITAL_COMMUNITY): Payer: Self-pay | Admitting: *Deleted

## 2019-04-01 ENCOUNTER — Encounter (HOSPITAL_COMMUNITY): Payer: Self-pay | Admitting: *Deleted

## 2019-04-01 NOTE — Telephone Encounter (Signed)
Preadmission screen  

## 2019-04-02 ENCOUNTER — Other Ambulatory Visit (HOSPITAL_COMMUNITY)
Admission: RE | Admit: 2019-04-02 | Discharge: 2019-04-02 | Disposition: A | Discharge status: Home / Self Care | Payer: No Typology Code available for payment source | Source: Ambulatory Visit | Attending: Obstetrics and Gynecology | Admitting: Obstetrics and Gynecology

## 2019-04-02 DIAGNOSIS — Z01812 Encounter for preprocedural laboratory examination: Secondary | ICD-10-CM | POA: Insufficient documentation

## 2019-04-02 DIAGNOSIS — Z20828 Contact with and (suspected) exposure to other viral communicable diseases: Secondary | ICD-10-CM | POA: Insufficient documentation

## 2019-04-02 LAB — SARS CORONAVIRUS 2 (TAT 6-24 HRS): SARS Coronavirus 2: NEGATIVE

## 2019-04-03 ENCOUNTER — Inpatient Hospital Stay (HOSPITAL_COMMUNITY): Payer: No Typology Code available for payment source

## 2019-04-04 ENCOUNTER — Encounter (HOSPITAL_COMMUNITY): Payer: Self-pay

## 2019-04-04 ENCOUNTER — Inpatient Hospital Stay (HOSPITAL_COMMUNITY): Payer: No Typology Code available for payment source | Admitting: Anesthesiology

## 2019-04-04 ENCOUNTER — Other Ambulatory Visit: Payer: Self-pay

## 2019-04-04 ENCOUNTER — Inpatient Hospital Stay (HOSPITAL_COMMUNITY)
Admission: RE | Admit: 2019-04-04 | Discharge: 2019-04-05 | DRG: 806 | Disposition: A | Discharge status: Home / Self Care | Payer: No Typology Code available for payment source | Attending: Obstetrics and Gynecology | Admitting: Obstetrics and Gynecology

## 2019-04-04 ENCOUNTER — Inpatient Hospital Stay (HOSPITAL_COMMUNITY): Payer: No Typology Code available for payment source

## 2019-04-04 DIAGNOSIS — Z20828 Contact with and (suspected) exposure to other viral communicable diseases: Secondary | ICD-10-CM | POA: Diagnosis present

## 2019-04-04 DIAGNOSIS — O9081 Anemia of the puerperium: Secondary | ICD-10-CM | POA: Diagnosis not present

## 2019-04-04 DIAGNOSIS — O99214 Obesity complicating childbirth: Secondary | ICD-10-CM | POA: Diagnosis present

## 2019-04-04 DIAGNOSIS — E669 Obesity, unspecified: Secondary | ICD-10-CM | POA: Diagnosis present

## 2019-04-04 DIAGNOSIS — D62 Acute posthemorrhagic anemia: Secondary | ICD-10-CM | POA: Diagnosis not present

## 2019-04-04 DIAGNOSIS — Z3A39 39 weeks gestation of pregnancy: Secondary | ICD-10-CM | POA: Diagnosis not present

## 2019-04-04 DIAGNOSIS — O368130 Decreased fetal movements, third trimester, not applicable or unspecified: Principal | ICD-10-CM | POA: Diagnosis present

## 2019-04-04 DIAGNOSIS — Z3490 Encounter for supervision of normal pregnancy, unspecified, unspecified trimester: Secondary | ICD-10-CM | POA: Diagnosis present

## 2019-04-04 LAB — CBC
HCT: 39.3 % (ref 36.0–46.0)
Hemoglobin: 13.4 g/dL (ref 12.0–15.0)
MCH: 30.2 pg (ref 26.0–34.0)
MCHC: 34.1 g/dL (ref 30.0–36.0)
MCV: 88.5 fL (ref 80.0–100.0)
Platelets: 236 10*3/uL (ref 150–400)
RBC: 4.44 MIL/uL (ref 3.87–5.11)
RDW: 12.7 % (ref 11.5–15.5)
WBC: 8.8 10*3/uL (ref 4.0–10.5)
nRBC: 0 % (ref 0.0–0.2)

## 2019-04-04 LAB — TYPE AND SCREEN
ABO/RH(D): O POS
Antibody Screen: NEGATIVE

## 2019-04-04 LAB — RPR: RPR Ser Ql: NONREACTIVE

## 2019-04-04 MED ORDER — LACTATED RINGERS IV SOLN
INTRAVENOUS | Status: DC
  Administered 2019-04-04 (×2): via INTRAVENOUS

## 2019-04-04 MED ORDER — ONDANSETRON HCL 4 MG PO TABS: 4.0000 mg | ORAL_TABLET | ORAL | Status: DC | PRN

## 2019-04-04 MED ORDER — BENZOCAINE-MENTHOL 20-0.5 % EX AERO
1.0000 "application " | INHALATION_SPRAY | CUTANEOUS | Status: DC | PRN
  Filled 2019-04-04: qty 56

## 2019-04-04 MED ORDER — COCONUT OIL OIL: 1.0000 "application " | TOPICAL_OIL | Status: DC | PRN

## 2019-04-04 MED ORDER — TERBUTALINE SULFATE 1 MG/ML IJ SOLN: 0.2500 mg | Freq: Once | INTRAMUSCULAR | Status: DC | PRN

## 2019-04-04 MED ORDER — ONDANSETRON HCL 4 MG/2ML IJ SOLN: 4.0000 mg | INTRAMUSCULAR | Status: DC | PRN

## 2019-04-04 MED ORDER — OXYTOCIN BOLUS FROM INFUSION
500.0000 mL | Freq: Once | INTRAVENOUS | Status: AC
  Administered 2019-04-04: 500 mL via INTRAVENOUS

## 2019-04-04 MED ORDER — SENNOSIDES-DOCUSATE SODIUM 8.6-50 MG PO TABS
2.0000 | ORAL_TABLET | ORAL | Status: DC
  Administered 2019-04-04: 2 via ORAL
  Filled 2019-04-04: qty 2

## 2019-04-04 MED ORDER — IBUPROFEN 600 MG PO TABS
600.0000 mg | ORAL_TABLET | Freq: Four times a day (QID) | ORAL | Status: DC
  Administered 2019-04-04 – 2019-04-05 (×4): 600 mg via ORAL
  Filled 2019-04-04 (×4): qty 1

## 2019-04-04 MED ORDER — METHYLERGONOVINE MALEATE 0.2 MG/ML IJ SOLN: 0.2000 mg | INTRAMUSCULAR | Status: DC | PRN

## 2019-04-04 MED ORDER — ACETAMINOPHEN 325 MG PO TABS: 650.0000 mg | ORAL_TABLET | ORAL | Status: DC | PRN

## 2019-04-04 MED ORDER — DIPHENHYDRAMINE HCL 25 MG PO CAPS: 25.0000 mg | ORAL_CAPSULE | Freq: Four times a day (QID) | ORAL | Status: DC | PRN

## 2019-04-04 MED ORDER — SOD CITRATE-CITRIC ACID 500-334 MG/5ML PO SOLN: 30.0000 mL | ORAL | Status: DC | PRN

## 2019-04-04 MED ORDER — WITCH HAZEL-GLYCERIN EX PADS: 1.0000 "application " | MEDICATED_PAD | CUTANEOUS | Status: DC | PRN

## 2019-04-04 MED ORDER — EPHEDRINE 5 MG/ML INJ: 10.0000 mg | INTRAVENOUS | Status: DC | PRN

## 2019-04-04 MED ORDER — PHENYLEPHRINE 40 MCG/ML (10ML) SYRINGE FOR IV PUSH (FOR BLOOD PRESSURE SUPPORT)
80.0000 ug | PREFILLED_SYRINGE | INTRAVENOUS | Status: DC | PRN
  Filled 2019-04-04: qty 10

## 2019-04-04 MED ORDER — OXYCODONE-ACETAMINOPHEN 5-325 MG PO TABS: 1.0000 | ORAL_TABLET | ORAL | Status: DC | PRN

## 2019-04-04 MED ORDER — ZOLPIDEM TARTRATE 5 MG PO TABS: 5.0000 mg | ORAL_TABLET | Freq: Every evening | ORAL | Status: DC | PRN

## 2019-04-04 MED ORDER — LACTATED RINGERS IV SOLN
500.0000 mL | Freq: Once | INTRAVENOUS | Status: AC
  Administered 2019-04-04: 500 mL via INTRAVENOUS

## 2019-04-04 MED ORDER — SODIUM CHLORIDE (PF) 0.9 % IJ SOLN
INTRAMUSCULAR | Status: DC | PRN
  Administered 2019-04-04: 12 mL/h via EPIDURAL

## 2019-04-04 MED ORDER — LIDOCAINE HCL (PF) 1 % IJ SOLN: 30.0000 mL | INTRAMUSCULAR | Status: DC | PRN

## 2019-04-04 MED ORDER — ACETAMINOPHEN 325 MG PO TABS
650.0000 mg | ORAL_TABLET | ORAL | Status: DC | PRN
  Administered 2019-04-05: 650 mg via ORAL
  Filled 2019-04-04: qty 2

## 2019-04-04 MED ORDER — ONDANSETRON HCL 4 MG/2ML IJ SOLN: 4.0000 mg | Freq: Four times a day (QID) | INTRAMUSCULAR | Status: DC | PRN

## 2019-04-04 MED ORDER — TETANUS-DIPHTH-ACELL PERTUSSIS 5-2.5-18.5 LF-MCG/0.5 IM SUSP: 0.5000 mL | Freq: Once | INTRAMUSCULAR | Status: DC

## 2019-04-04 MED ORDER — LIDOCAINE HCL (PF) 1 % IJ SOLN
INTRAMUSCULAR | Status: DC | PRN
  Administered 2019-04-04 (×2): 5 mL via EPIDURAL

## 2019-04-04 MED ORDER — DIPHENHYDRAMINE HCL 50 MG/ML IJ SOLN: 12.5000 mg | INTRAMUSCULAR | Status: DC | PRN

## 2019-04-04 MED ORDER — SIMETHICONE 80 MG PO CHEW: 80.0000 mg | CHEWABLE_TABLET | ORAL | Status: DC | PRN

## 2019-04-04 MED ORDER — PHENYLEPHRINE 40 MCG/ML (10ML) SYRINGE FOR IV PUSH (FOR BLOOD PRESSURE SUPPORT): 80.0000 ug | PREFILLED_SYRINGE | INTRAVENOUS | Status: DC | PRN

## 2019-04-04 MED ORDER — OXYTOCIN 40 UNITS IN NORMAL SALINE INFUSION - SIMPLE MED
1.0000 m[IU]/min | INTRAVENOUS | Status: DC
  Administered 2019-04-04: 2 m[IU]/min via INTRAVENOUS

## 2019-04-04 MED ORDER — MISOPROSTOL 25 MCG QUARTER TABLET: 25.0000 ug | ORAL_TABLET | ORAL | Status: DC | PRN

## 2019-04-04 MED ORDER — FENTANYL-BUPIVACAINE-NACL 0.5-0.125-0.9 MG/250ML-% EP SOLN
12.0000 mL/h | EPIDURAL | Status: DC | PRN
  Filled 2019-04-04: qty 250

## 2019-04-04 MED ORDER — LACTATED RINGERS IV SOLN: 500.0000 mL | INTRAVENOUS | Status: DC | PRN

## 2019-04-04 MED ORDER — OXYCODONE-ACETAMINOPHEN 5-325 MG PO TABS: 2.0000 | ORAL_TABLET | ORAL | Status: DC | PRN

## 2019-04-04 MED ORDER — DIBUCAINE (PERIANAL) 1 % EX OINT: 1.0000 "application " | TOPICAL_OINTMENT | CUTANEOUS | Status: DC | PRN

## 2019-04-04 MED ORDER — PRENATAL MULTIVITAMIN CH
1.0000 | ORAL_TABLET | Freq: Every day | ORAL | Status: DC
  Administered 2019-04-05: 1 via ORAL
  Filled 2019-04-04: qty 1

## 2019-04-04 MED ORDER — OXYTOCIN 40 UNITS IN NORMAL SALINE INFUSION - SIMPLE MED
2.5000 [IU]/h | INTRAVENOUS | Status: DC
  Filled 2019-04-04: qty 1000

## 2019-04-04 MED ORDER — METHYLERGONOVINE MALEATE 0.2 MG PO TABS: 0.2000 mg | ORAL_TABLET | ORAL | Status: DC | PRN

## 2019-04-04 NOTE — Anesthesia Preprocedure Evaluation (Signed)

## 2019-04-04 NOTE — Anesthesia Procedure Notes (Signed)
Epidural Patient location during procedure: OB  Staffing Anesthesiologist: Kutler Vanvranken, MD Performed: anesthesiologist   Preanesthetic Checklist Completed: patient identified, site marked, surgical consent, pre-op evaluation, timeout performed, IV checked, risks and benefits discussed and monitors and equipment checked  Epidural Patient position: sitting Prep: DuraPrep Patient monitoring: heart rate, continuous pulse ox and blood pressure Approach: midline Location: L3-L4 Injection technique: LOR saline  Needle:  Needle type: Tuohy  Needle gauge: 17 G Needle length: 9 cm and 9 Needle insertion depth: 7 cm Catheter type: closed end flexible Catheter size: 20 Guage Catheter at skin depth: 11 cm Test dose: negative  Assessment Events: blood not aspirated, injection not painful, no injection resistance, negative IV test and no paresthesia  Additional Notes Patient identified. Risks/Benefits/Options discussed with patient including but not limited to bleeding, infection, nerve damage, paralysis, failed block, incomplete pain control, headache, blood pressure changes, nausea, vomiting, reactions to medication both or allergic, itching and postpartum back pain. Confirmed with bedside nurse the patient's most recent platelet count. Confirmed with patient that they are not currently taking any anticoagulation, have any bleeding history or any family history of bleeding disorders. Patient expressed understanding and wished to proceed. All questions were answered. Sterile technique was used throughout the entire procedure. Please see nursing notes for vital signs. Test dose was given through epidural needle and negative prior to continuing to dose epidural or start infusion. Warning signs of high block given to the patient including shortness of breath, tingling/numbness in hands, complete motor block, or any concerning symptoms with instructions to call for help. Patient was given  instructions on fall risk and not to get out of bed. All questions and concerns addressed with instructions to call with any issues.     

## 2019-04-04 NOTE — H&P (Signed)
Katherine Hardy is a 28 y.o. female presenting for IOL for chronic DFM OB History    Gravida  2   Para  1   Term  1   Preterm      AB      Living  1     SAB      TAB      Ectopic      Multiple  0   Live Births  1          Past Medical History:  Diagnosis Date  . Anxiety   . Postpartum care following vaginal delivery (6/30) 10/26/2015   Past Surgical History:  Procedure Laterality Date  . WISDOM TOOTH EXTRACTION     Family History: family history includes Atrial fibrillation in her maternal grandmother; Diabetes in her paternal grandfather; Heart disease (age of onset: 72) in her paternal grandfather; Hyperlipidemia in her maternal grandfather and maternal grandmother; Hypertension in her father and mother; Macular degeneration in her maternal grandmother; Stroke in her maternal grandmother. Social History:  reports that she has never smoked. She has never used smokeless tobacco. She reports current alcohol use. She reports that she does not use drugs.     Maternal Diabetes: No Genetic Screening: Normal Maternal Ultrasounds/Referrals: Normal Fetal Ultrasounds or other Referrals:  None Maternal Substance Abuse:  No Significant Maternal Medications:  None Significant Maternal Lab Results:  None and Group B Strep negative Other Comments:  None  Review of Systems  Constitutional: Negative.   All other systems reviewed and are negative.  Maternal Medical History:  Contractions: Onset was less than 1 hour ago.   Frequency: rare.   Perceived severity is mild.    Fetal activity: Perceived fetal activity is decreased.   Last perceived fetal movement was greater than 24 hours ago.    Prenatal complications: no prenatal complications Prenatal Complications - Diabetes: none.      Height 5\' 2"  (1.575 m), weight 93 kg, unknown if currently breastfeeding. Maternal Exam:  Uterine Assessment: Contraction strength is mild.  Contraction frequency is irregular.    Abdomen: Patient reports no abdominal tenderness. Fetal presentation: vertex  Introitus: Normal vulva. Normal vagina.  Ferning test: not done.  Nitrazine test: not done. Amniotic fluid character: not assessed.  Pelvis: adequate for delivery.   Cervix: Cervix evaluated by digital exam.     Physical Exam  Nursing note and vitals reviewed. Constitutional: She is oriented to person, place, and time. She appears well-developed and well-nourished.  Neck: Normal range of motion. Neck supple.  Cardiovascular: Normal rate and regular rhythm.  Respiratory: Effort normal and breath sounds normal.  GI: Soft. Bowel sounds are normal.  Genitourinary:    Vulva, vagina and uterus normal.   Musculoskeletal: Normal range of motion.  Neurological: She is alert and oriented to person, place, and time. She has normal reflexes.  Skin: Skin is warm and dry.  Psychiatric: She has a normal mood and affect.    Prenatal labs: ABO, Rh: --/--/O POS, O POS Performed at [facility], [place] [address] [place], [place] [address]  [phone]) Antibody: NEG (11/24 0715) Rubella: Immune (05/13 0000) RPR: Nonreactive (05/13 0000)  HBsAg: Negative (05/13 0000)  HIV: Non-reactive (05/13 0000)  GBS: Negative/-- (11/13 0000)   Assessment/Plan: 39wk IUP Chronic DFM IOL  Issaiah Seabrooks J 04/04/2019, 7:56 AM

## 2019-04-04 NOTE — Progress Notes (Signed)
[name] was referred for history of depression/anxiety.  * Referral screened out by Clinical Social Worker because none of the following criteria appear to apply:  ~ History of anxiety/depression during this pregnancy, or of post-partum depression following prior delivery. ~ Diagnosis of anxiety and/or depression within last 3 years. Per chart review, [name] has had anxiety since the age of 16. No concerns noted in [facility] records. OR * [name]'s symptoms currently being treated with medication and/or therapy.  Please contact the Clinical Social Worker if needs arise, by [name] request, or if [name] scores greater than 9/yes to question 10 on [place] Postpartum Depression Screen.  Katherine Hardy, LCSWA  [facility] [phone]  

## 2019-04-05 LAB — CBC
HCT: 32 % — ABNORMAL LOW (ref 36.0–46.0)
Hemoglobin: 10.9 g/dL — ABNORMAL LOW (ref 12.0–15.0)
MCH: 29.9 pg (ref 26.0–34.0)
MCHC: 34.1 g/dL (ref 30.0–36.0)
MCV: 87.7 fL (ref 80.0–100.0)
Platelets: 204 10*3/uL (ref 150–400)
RBC: 3.65 MIL/uL — ABNORMAL LOW (ref 3.87–5.11)
RDW: 12.6 % (ref 11.5–15.5)
WBC: 10.6 10*3/uL — ABNORMAL HIGH (ref 4.0–10.5)
nRBC: 0 % (ref 0.0–0.2)

## 2019-04-05 MED ORDER — IBUPROFEN 600 MG PO TABS: 600.0000 mg | ORAL_TABLET | Freq: Four times a day (QID) | ORAL | 0 refills | Status: AC

## 2019-04-05 MED ORDER — MAGNESIUM OXIDE 400 (241.3 MG) MG PO TABS
400.0000 mg | ORAL_TABLET | Freq: Every day | ORAL | Status: DC
  Administered 2019-04-05: 400 mg via ORAL
  Filled 2019-04-05: qty 1

## 2019-04-05 MED ORDER — POLYSACCHARIDE IRON COMPLEX 150 MG PO CAPS
150.0000 mg | ORAL_CAPSULE | Freq: Every day | ORAL | Status: DC
  Administered 2019-04-05: 150 mg via ORAL
  Filled 2019-04-05: qty 1

## 2019-04-05 MED ORDER — BENZOCAINE-MENTHOL 20-0.5 % EX AERO: 1.0000 "application " | INHALATION_SPRAY | CUTANEOUS | 0 refills | Status: AC | PRN

## 2019-04-05 MED ORDER — POLYSACCHARIDE IRON COMPLEX 150 MG PO CAPS: 150.0000 mg | ORAL_CAPSULE | Freq: Every day | ORAL | 3 refills | Status: AC

## 2019-04-05 MED ORDER — ACETAMINOPHEN 325 MG PO TABS: 650.0000 mg | ORAL_TABLET | ORAL | 0 refills | Status: AC | PRN

## 2019-04-05 NOTE — Lactation Note (Signed)
This note was copied from a baby's chart. Lactation Consultation Note  Patient Name: Katherine Hardy [id number]'Y Date: 04/05/2019 Reason for consult: Initial assessment;Term P2, 16 hour female infant. Mom is experienced at breastfeeding she breastfed her 30 year old for 9 months. Mom was doing STS with infant, infant was not cuing to breastfeed at this time. Per mom, she has DEBP at home. Mom declined donor milk if supplementation is need she prefers to exclusively breastfeed infant.  Infant had 3 voids and 5 stools since birth. Per mom, most feedings have been 10 minutes. 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 LC did not observe latch, per  mom, she  finished breastfeeding infant 20 minutes prior to [facility] entering the room. LC discussed with mom she can breastfeed infant and hand express afterwards her choice to give extra volume to infant. Mom knows to breastfeed according to hunger cues, 8 to 12 times within 24 hours and on demand. Mom knows to call RN or LC if she has any questions, concerns or need assistance with latching infant to breast. Mom made aware of O/P services, breastfeeding support groups, community resources, and our phone # for post-discharge questions.   Maternal Data Formula Feeding for Exclusion: No Has patient been taught Hand Expression?: Yes(Per mom, she knows how to hand express.) Does the patient have breastfeeding experience prior to this delivery?: Yes  Feeding Feeding Type: Breast Fed  LATCH Score                   Interventions Interventions: Breast feeding basics reviewed;Skin to skin;Hand express;DEBP  Lactation Tools Discussed/Used WIC Program: No   Consult Status Consult Status: Follow-up Date: 04/05/19 Follow-up type: In-patient    Vicente Serene 04/05/2019, 5:08 AM

## 2019-04-05 NOTE — Progress Notes (Signed)
PPD #1, SVD, 2nd degree perineal, baby boy   S:  Reports feeling good, tired; requesting early d/c home at 24 hours              Tolerating po/ No nausea or vomiting / Denies dizziness or SOB             Bleeding is getting lighter              Pain controlled with Motrin and Tylenol             Up ad lib / ambulatory / voiding QS  Newborn breast feeding - going well; mom reports lactation said he has a tongue tie, but states she does not feel like it is interfering with feedings  / Circumcision - planning prior to d/c  O:               VS: BP 114/69 (BP Location: Left Arm)   Pulse 84   Temp 98.1 F (36.7 C) (Oral)   Resp 18   Ht 5\' 2"  (1.575 m)   Wt 93.4 kg   SpO2 99%   Breastfeeding Unknown   BMI 37.68 kg/m    LABS:              Recent Labs    04/04/19 0757 04/05/19 0559  WBC 8.8 10.6*  HGB 13.4 10.9*  PLT 236 204               Blood type: --/--/O POS (12/07 0757)  Rubella: Immune (05/13 0000)                     I&O: Intake/Output      12/07 0701 - 12/08 0700 12/08 0701 - 12/09 0700   I.V. (mL/kg) 581.5 (6.2)    Other 0    Total Intake(mL/kg) 581.5 (6.2)    Urine (mL/kg/hr) 650    Blood 276    Total Output 926    Net -344.5         Urine Occurrence 1 x                  Physical Exam:             Alert and oriented X3  Lungs: Clear and unlabored  Heart: regular rate and rhythm / no murmurs  Abdomen: soft, non-tender, non-distended              Fundus: firm, non-tender, U-4  Perineum: well approximated 2nd degree repair, mild edema, no erythema or ecchymosis   Lochia: small rubra noted on pad   Extremities: trace LE edema, no calf pain or tenderness    A/P: PPD # 1, SVD  2nd degree perineal repair   ABL Anemia   - Niferex 150mg  PO daily   - Magnesium oxide 400mg  PO daily   Hx. Of Anxiety   - not on meds; warning s/s reviewed   Doing well - stable status  Routine post partum orders  Circ prior to d/c  See lactation prior to d/c  Discharge home  today  WOB discharge book given, instructions and warning s/s reviewed   F/u with Dr. Ronita Hipps in 6 weeks   Lars Pinks, MSN, [facility]

## 2019-04-05 NOTE — Discharge Summary (Signed)
Obstetric Discharge Summary   Patient Name: Katherine Hardy DOB: 06/13/1990 MRN: [id number]  Date of Admission: 04/04/2019 Date of Discharge: 04/05/2019 Date of Delivery: 04/04/2019 Gestational Age at Delivery: [redacted]w[redacted]d  Primary OB: [facility] - Dr. Billy Coast  Antepartum complications:  - Chronic DFM - Hx. Of anxiety not on meds - Obesity - Breech presentation with successful ECV Prenatal Labs:  ABO, Rh: --/--/O POS, O POS Performed at [facility], [address] [place], [place] [address]  [phone]) Antibody: NEG (11/24 0715) Rubella: Immune (05/13 0000) RPR: Nonreactive (05/13 0000)  HBsAg: Negative (05/13 0000)  HIV: Non-reactive (05/13 0000)  GBS: Negative/-- (11/13 0000)   Admitting Diagnosis: 39 weeks IOL chronic DFM  Secondary Diagnoses: Patient Active Problem List   Diagnosis Date Noted  . Encounter for planned induction of labor 04/04/2019  . Second degree perineal laceration 04/04/2019  . Postpartum care following vaginal delivery (12/7) 10/26/2015  . Fever 11/29/2014  . Anxiety state 07/11/2014    Augmentation: Pitocin Complications: None  Date of Delivery: 04/04/2019 Delivered By: Dr. Billy Coast Delivery Type: spontaneous vaginal delivery Anesthesia: epidural Placenta: spontaneous Laceration: 2nd degree perineal  Episiotomy: none  Newborn Data: Live born female  Birth Weight: 8 lb 1.5 oz (3670 g) APGAR: 9, 9  Newborn Delivery   Birth date/time: 04/04/2019 13:08:00 Delivery type: Vaginal, Spontaneous      Hospital/Postpartum Course  (Vaginal Delivery): Pt. Admitted for IOL at 39 weeks.  She progressed with Pitocin and spontaneously ruptured membranes.  She delivered by NSVD. See notes, delivery summary for details. Patient had an uncomplicated postpartum course.  By time of discharge on PPD#1, her pain was controlled on oral pain medications; she had appropriate lochia and was ambulating, voiding without difficulty and tolerating regular  diet.  She was deemed stable for discharge to home.     Labs: CBC Latest Ref Rng & Units 04/05/2019 04/04/2019 03/22/2019  WBC 4.0 - 10.5 K/uL 10.6(H) 8.8 9.8  Hemoglobin 12.0 - 15.0 g/dL 10.9(L) 13.4 12.9  Hematocrit 36.0 - 46.0 % 32.0(L) 39.3 37.9  Platelets 150 - 400 K/uL 204 236 208   O POS  Physical exam:  BP 114/69 (BP Location: Left Arm)   Pulse 84   Temp 98.1 F (36.7 C) (Oral)   Resp 18   Ht 5\' 2"  (1.575 m)   Wt 93.4 kg   SpO2 99%   Breastfeeding Unknown   BMI 37.68 kg/m  General: alert and no distress Pulm: normal respiratory effort Lochia: appropriate Abdomen: soft, NT Uterine Fundus: firm, below umbilicus Perineum: healing well, no significant erythema, no significant edema Extremities: No evidence of DVT seen on physical exam. No lower extremity edema.   Disposition: stable, discharge to home Baby Feeding: breast milk Baby Disposition: home with mom  Rh Immune globulin given: N/A Rubella vaccine given: N/A Tdap vaccine given in AP or PP setting: UTD Flu vaccine given in AP or PP setting: declines   Plan:  was discharged to home in good condition. Follow-up appointment at [facility] in 6 weeks.  Discharge Instructions: Per After Visit Summary. Refer to After Visit Summary and [facility] discharge booklet  Activity: Advance as tolerated. Pelvic rest for 6 weeks.   Diet: Regular, Heart Healthy Discharge Medications: Allergies as of 04/05/2019      Reactions   Ppd [tuberculin Purified Protein Derivative] Hives, Other (See Comments)   Causes fever.   Sulfa Antibiotics Nausea And Vomiting, Other (See Comments)   Syncope  Medication List    STOP taking these medications   azithromycin 250 MG tablet Commonly known as: ZITHROMAX   calcium carbonate 750 MG chewable tablet Commonly known as: TUMS EX   coconut oil Oil   dibucaine 1 % Oint Commonly known as: NUPERCAINAL   witch hazel-glycerin pad Commonly known as:  TUCKS     TAKE these medications   acetaminophen 325 MG tablet Commonly known as: Tylenol Take 2 tablets (650 mg total) by mouth every 4 (four) hours as needed (for pain scale < 4). What changed:   medication strength  how much to take  when to take this  reasons to take this   benzocaine-Menthol 20-0.5 % Aero Commonly known as: DERMOPLAST Apply 1 application topically as needed for irritation (perineal discomfort).   ibuprofen 600 MG tablet Commonly known as: ADVIL Take 1 tablet (600 mg total) by mouth every 6 (six) hours.   iron polysaccharides 150 MG capsule Commonly known as: NIFEREX Take 1 capsule (150 mg total) by mouth daily.   polyethylene glycol 17 g packet Commonly known as: MIRALAX / GLYCOLAX Take 17 g by mouth daily as needed.   prenatal multivitamin Tabs tablet Take 1 tablet by mouth daily at 12 noon.            Discharge Care Instructions  (From admission, onward)         Start     Ordered   04/05/19 0000  Discharge wound care:    Comments: Warm water sitz baths as needed for repair   04/05/19 1026         Outpatient follow up:  Follow-up Information    Brien Few, MD. Go in 6 week(s).   Specialty: Obstetrics and Gynecology Why: Postpartum visit Contact information: [place] [address] [phone]           Signed:  Lars Pinks, MSN, CNM [facility]

## 2019-04-05 NOTE — Anesthesia Postprocedure Evaluation (Signed)
Anesthesia Post Note  Patient: Katherine Hardy  Procedure(s) Performed: AN AD [place]     Patient location during evaluation: Mother Baby Anesthesia Type: Epidural Level of consciousness: awake Pain management: satisfactory to patient Vital Signs Assessment: post-procedure vital signs reviewed and stable Respiratory status: spontaneous breathing Cardiovascular status: stable Anesthetic complications: no    Last Vitals:  Vitals:   04/05/19 0015 04/05/19 0440  BP: 115/74 114/69  Pulse: 71 84  Resp: 18 18  Temp: 36.7 C 36.7 C  SpO2: 99% 99%    Last Pain:  Vitals:   04/05/19 0440  TempSrc: Oral  PainSc: 4    Pain Goal:                   [employer]

## 2019-04-05 NOTE — Lactation Note (Signed)
This note was copied from a baby's chart. Lactation Consultation Note  Patient Name: Katherine Hardy [id number] Date: 04/05/2019 Reason for consult: Follow-up assessment;Term;Infant weight loss;Other (Comment)(post circ. 4 % weight loss) Baby is 23 hours and is for early D/C today.  As LC entered the room per mom baby was circ'd this am. Last fed at 1100 for 15 mins. Per mom he has a tongue - tie like our 1st baby and we did not get it tx.  Mom mentioned she was sore at 1st and powered through and breast fed for 9 months when my milk dried up after going back to work.  Mom denied sore nipples as of now. Sore nipples and engorgement prevention and tx reviewed. LC provided comfort gels x 6 days and alternating  With breast shells except when sleeping. Per mom has a hand pump and a DEBP at home.  LC recommended due to the tongue tie ( identified per mom by the pediatrician ). Prior to latch to prevent soreness , breast massage, hand express, pre-pump  With hand pump and reverse pressure. After feeding - comfort gels alternating with shells.  LC mentioned f/u for the tongue - tie is recommended if the baby is not gaining 'well , sore nipples occur and aren't healing, baby unable to soften the 1st breast well.  Mom has the [facility] O/P phone number and the [facility] office if needed.   Maternal Data    Feeding Feeding Type: (post circ sleepy after assessment)  LATCH Score                   Interventions Interventions: Breast feeding basics reviewed;Comfort gels;Shells  Lactation Tools Discussed/Used Tools: Shells;Comfort gels Shell Type: Inverted Pump Review: Milk Storage   Consult Status Consult Status: Complete Date: 04/05/19    Myer Haff 04/05/2019, 12:32 PM
